# Patient Record
Sex: Male | Born: 1950 | Race: White | Hispanic: No | Marital: Married | Smoking: Never smoker
Health system: Southern US, Community
[De-identification: ages and names within clinical notes are randomized; demographics above are authoritative.]

## PROBLEM LIST (undated history)

## (undated) DIAGNOSIS — E669 Obesity, unspecified: Secondary | ICD-10-CM

## (undated) DIAGNOSIS — M549 Dorsalgia, unspecified: Secondary | ICD-10-CM

## (undated) DIAGNOSIS — I10 Essential (primary) hypertension: Secondary | ICD-10-CM

## (undated) DIAGNOSIS — K259 Gastric ulcer, unspecified as acute or chronic, without hemorrhage or perforation: Secondary | ICD-10-CM

## (undated) DIAGNOSIS — K922 Gastrointestinal hemorrhage, unspecified: Secondary | ICD-10-CM

## (undated) DIAGNOSIS — H269 Unspecified cataract: Secondary | ICD-10-CM

## (undated) DIAGNOSIS — G4730 Sleep apnea, unspecified: Secondary | ICD-10-CM

## (undated) DIAGNOSIS — R29898 Other symptoms and signs involving the musculoskeletal system: Secondary | ICD-10-CM

## (undated) HISTORY — DX: Gastrointestinal hemorrhage, unspecified: K92.2

---

## 2008-02-11 ENCOUNTER — Emergency Department (HOSPITAL_COMMUNITY): Admission: EM | Admit: 2008-02-11 | Discharge: 2008-02-11 | Payer: Self-pay | Admitting: Family Medicine

## 2009-02-05 ENCOUNTER — Emergency Department (HOSPITAL_COMMUNITY): Admission: EM | Admit: 2009-02-05 | Discharge: 2009-02-05 | Payer: Self-pay | Admitting: Family Medicine

## 2010-06-07 ENCOUNTER — Emergency Department (HOSPITAL_COMMUNITY): Admission: EM | Admit: 2010-06-07 | Discharge: 2010-06-07 | Payer: Self-pay | Admitting: Emergency Medicine

## 2010-06-09 ENCOUNTER — Emergency Department (HOSPITAL_COMMUNITY): Admission: EM | Admit: 2010-06-09 | Discharge: 2010-06-09 | Payer: Self-pay | Admitting: Family Medicine

## 2014-01-05 ENCOUNTER — Encounter (HOSPITAL_COMMUNITY): Payer: Self-pay | Admitting: Emergency Medicine

## 2014-01-05 ENCOUNTER — Emergency Department (HOSPITAL_COMMUNITY): Payer: Federal, State, Local not specified - PPO

## 2014-01-05 ENCOUNTER — Inpatient Hospital Stay (HOSPITAL_COMMUNITY)
Admission: EM | Admit: 2014-01-05 | Discharge: 2014-01-07 | DRG: 378 | Disposition: A | Discharge status: Home / Self Care | Payer: Federal, State, Local not specified - PPO | Attending: Family Medicine | Admitting: Family Medicine

## 2014-01-05 DIAGNOSIS — Z9989 Dependence on other enabling machines and devices: Secondary | ICD-10-CM

## 2014-01-05 DIAGNOSIS — M1990 Unspecified osteoarthritis, unspecified site: Secondary | ICD-10-CM | POA: Diagnosis present

## 2014-01-05 DIAGNOSIS — K922 Gastrointestinal hemorrhage, unspecified: Secondary | ICD-10-CM | POA: Diagnosis present

## 2014-01-05 DIAGNOSIS — T3995XA Adverse effect of unspecified nonopioid analgesic, antipyretic and antirheumatic, initial encounter: Secondary | ICD-10-CM | POA: Diagnosis present

## 2014-01-05 DIAGNOSIS — K257 Chronic gastric ulcer without hemorrhage or perforation: Secondary | ICD-10-CM

## 2014-01-05 DIAGNOSIS — Z6841 Body Mass Index (BMI) 40.0 and over, adult: Secondary | ICD-10-CM

## 2014-01-05 DIAGNOSIS — K2970 Gastritis, unspecified, without bleeding: Secondary | ICD-10-CM | POA: Diagnosis present

## 2014-01-05 DIAGNOSIS — E785 Hyperlipidemia, unspecified: Secondary | ICD-10-CM | POA: Diagnosis present

## 2014-01-05 DIAGNOSIS — K259 Gastric ulcer, unspecified as acute or chronic, without hemorrhage or perforation: Secondary | ICD-10-CM

## 2014-01-05 DIAGNOSIS — I10 Essential (primary) hypertension: Secondary | ICD-10-CM | POA: Diagnosis present

## 2014-01-05 DIAGNOSIS — D649 Anemia, unspecified: Secondary | ICD-10-CM

## 2014-01-05 DIAGNOSIS — K284 Chronic or unspecified gastrojejunal ulcer with hemorrhage: Principal | ICD-10-CM | POA: Diagnosis present

## 2014-01-05 DIAGNOSIS — K2990 Gastroduodenitis, unspecified, without bleeding: Secondary | ICD-10-CM

## 2014-01-05 DIAGNOSIS — D62 Acute posthemorrhagic anemia: Secondary | ICD-10-CM | POA: Diagnosis present

## 2014-01-05 DIAGNOSIS — G4733 Obstructive sleep apnea (adult) (pediatric): Secondary | ICD-10-CM | POA: Diagnosis present

## 2014-01-05 HISTORY — DX: Other symptoms and signs involving the musculoskeletal system: R29.898

## 2014-01-05 HISTORY — DX: Essential (primary) hypertension: I10

## 2014-01-05 HISTORY — DX: Obesity, unspecified: E66.9

## 2014-01-05 HISTORY — DX: Dorsalgia, unspecified: M54.9

## 2014-01-05 HISTORY — DX: Unspecified cataract: H26.9

## 2014-01-05 HISTORY — DX: Sleep apnea, unspecified: G47.30

## 2014-01-05 LAB — URINALYSIS, ROUTINE W REFLEX MICROSCOPIC
Bilirubin Urine: NEGATIVE
Glucose, UA: NEGATIVE mg/dL
Hgb urine dipstick: NEGATIVE
Ketones, ur: NEGATIVE mg/dL
Leukocytes, UA: NEGATIVE
Nitrite: NEGATIVE
Protein, ur: NEGATIVE mg/dL
Specific Gravity, Urine: 1.023 (ref 1.005–1.030)
Urobilinogen, UA: 0.2 mg/dL (ref 0.0–1.0)
pH: 5 (ref 5.0–8.0)

## 2014-01-05 LAB — CBC WITH DIFFERENTIAL/PLATELET
BASOS PCT: 0 % (ref 0–1)
Basophils Absolute: 0 10*3/uL (ref 0.0–0.1)
Eosinophils Absolute: 0 10*3/uL (ref 0.0–0.7)
Eosinophils Relative: 0 % (ref 0–5)
HEMATOCRIT: 20.7 % — AB (ref 39.0–52.0)
HEMOGLOBIN: 7 g/dL — AB (ref 13.0–17.0)
LYMPHS ABS: 2.6 10*3/uL (ref 0.7–4.0)
LYMPHS PCT: 13 % (ref 12–46)
MCH: 28.3 pg (ref 26.0–34.0)
MCHC: 33.8 g/dL (ref 30.0–36.0)
MCV: 83.8 fL (ref 78.0–100.0)
MONO ABS: 1.4 10*3/uL — AB (ref 0.1–1.0)
MONOS PCT: 7 % (ref 3–12)
NEUTROS ABS: 17 10*3/uL — AB (ref 1.7–7.7)
NEUTROS PCT: 81 % — AB (ref 43–77)
Platelets: 242 10*3/uL (ref 150–400)
RBC: 2.47 MIL/uL — AB (ref 4.22–5.81)
RDW: 14.2 % (ref 11.5–15.5)
WBC: 21.1 10*3/uL — AB (ref 4.0–10.5)

## 2014-01-05 LAB — COMPREHENSIVE METABOLIC PANEL
ALBUMIN: 3.4 g/dL — AB (ref 3.5–5.2)
ALK PHOS: 55 U/L (ref 39–117)
ALT: 10 U/L (ref 0–53)
AST: 10 U/L (ref 0–37)
BILIRUBIN TOTAL: 0.3 mg/dL (ref 0.3–1.2)
BUN: 34 mg/dL — AB (ref 6–23)
CHLORIDE: 98 meq/L (ref 96–112)
CO2: 21 meq/L (ref 19–32)
Calcium: 8.1 mg/dL — ABNORMAL LOW (ref 8.4–10.5)
Creatinine, Ser: 0.75 mg/dL (ref 0.50–1.35)
GFR calc Af Amer: >90 mL/min (ref >90)
Glucose, Bld: 188 mg/dL — ABNORMAL HIGH (ref 70–99)
POTASSIUM: 4.4 meq/L (ref 3.7–5.3)
Sodium: 134 mEq/L — ABNORMAL LOW (ref 137–147)
Total Protein: 6.2 g/dL (ref 6.0–8.3)

## 2014-01-05 LAB — OCCULT BLOOD, POC DEVICE: Fecal Occult Bld: POSITIVE — AB

## 2014-01-05 LAB — CBC
HEMATOCRIT: 19.1 % — AB (ref 39.0–52.0)
HEMOGLOBIN: 6.7 g/dL — AB (ref 13.0–17.0)
MCH: 29.8 pg (ref 26.0–34.0)
MCHC: 35.1 g/dL (ref 30.0–36.0)
MCV: 84.9 fL (ref 78.0–100.0)
Platelets: 209 10*3/uL (ref 150–400)
RBC: 2.25 MIL/uL — AB (ref 4.22–5.81)
RDW: 14.3 % (ref 11.5–15.5)
WBC: 18.2 10*3/uL — ABNORMAL HIGH (ref 4.0–10.5)

## 2014-01-05 LAB — INFLUENZA PANEL BY PCR (TYPE A & B)
H1N1 flu by pcr: NOT DETECTED
Influenza A By PCR: NEGATIVE
Influenza B By PCR: NEGATIVE

## 2014-01-05 LAB — PREPARE RBC (CROSSMATCH)

## 2014-01-05 LAB — TROPONIN I

## 2014-01-05 LAB — PRO B NATRIURETIC PEPTIDE: Pro B Natriuretic peptide (BNP): 127.2 pg/mL — ABNORMAL HIGH (ref 0–125)

## 2014-01-05 LAB — ABO/RH: ABO/RH(D): A POS

## 2014-01-05 MED ORDER — SODIUM CHLORIDE 0.9 % IV SOLN
INTRAVENOUS | Status: DC
  Administered 2014-01-05: 14:00:00 via INTRAVENOUS
  Administered 2014-01-06: 16:00:00 125 mL/h via INTRAVENOUS
  Administered 2014-01-06: 07:00:00 via INTRAVENOUS
  Administered 2014-01-07: 10:00:00 100 mL/h via INTRAVENOUS
  Administered 2014-01-07: via INTRAVENOUS

## 2014-01-05 MED ORDER — ATORVASTATIN CALCIUM 10 MG PO TABS
10.0000 mg | ORAL_TABLET | Freq: Every day | ORAL | Status: DC
  Administered 2014-01-05 – 2014-01-06 (×2): 10 mg via ORAL
  Filled 2014-01-05 (×3): qty 1

## 2014-01-05 MED ORDER — HYDROCHLOROTHIAZIDE 25 MG PO TABS
25.0000 mg | ORAL_TABLET | Freq: Every day | ORAL | Status: DC
  Administered 2014-01-07: 25 mg via ORAL
  Filled 2014-01-05 (×2): qty 1

## 2014-01-05 MED ORDER — PANTOPRAZOLE SODIUM 40 MG IV SOLR
40.0000 mg | Freq: Once | INTRAVENOUS | Status: AC
  Administered 2014-01-05: 40 mg via INTRAVENOUS
  Filled 2014-01-05: qty 40

## 2014-01-05 MED ORDER — SODIUM CHLORIDE 0.9 % IV BOLUS (SEPSIS)
500.0000 mL | Freq: Once | INTRAVENOUS | Status: AC
  Administered 2014-01-05: 500 mL via INTRAVENOUS

## 2014-01-05 MED ORDER — PANTOPRAZOLE SODIUM 40 MG IV SOLR
40.0000 mg | Freq: Two times a day (BID) | INTRAVENOUS | Status: DC
  Administered 2014-01-05: 40 mg via INTRAVENOUS
  Filled 2014-01-05 (×3): qty 40

## 2014-01-05 MED ORDER — ONDANSETRON HCL 4 MG PO TABS: 4.0000 mg | ORAL_TABLET | Freq: Four times a day (QID) | ORAL | Status: DC | PRN

## 2014-01-05 MED ORDER — ONDANSETRON HCL 4 MG/2ML IJ SOLN
4.0000 mg | Freq: Four times a day (QID) | INTRAMUSCULAR | Status: DC | PRN
Start: 2014-01-05 — End: 2014-01-07

## 2014-01-05 MED ORDER — CLONIDINE HCL 0.1 MG PO TABS
0.1000 mg | ORAL_TABLET | Freq: Two times a day (BID) | ORAL | Status: DC
  Administered 2014-01-05 – 2014-01-07 (×4): 0.1 mg via ORAL
  Filled 2014-01-05 (×6): qty 1

## 2014-01-05 MED ORDER — AMLODIPINE BESYLATE 10 MG PO TABS
10.0000 mg | ORAL_TABLET | Freq: Every day | ORAL | Status: DC
  Administered 2014-01-07: 10 mg via ORAL
  Filled 2014-01-05 (×2): qty 1

## 2014-01-05 MED ORDER — ACETAMINOPHEN 650 MG RE SUPP: 650.0000 mg | Freq: Four times a day (QID) | RECTAL | Status: DC | PRN

## 2014-01-05 MED ORDER — ACETAMINOPHEN 325 MG PO TABS
650.0000 mg | ORAL_TABLET | Freq: Four times a day (QID) | ORAL | Status: DC | PRN
  Administered 2014-01-05 – 2014-01-07 (×3): 650 mg via ORAL
  Filled 2014-01-05 (×3): qty 2

## 2014-01-05 NOTE — Progress Notes (Signed)
CRITICAL VALUE ALERT  Critical value received:  Hgb 6.7  Date of notification:  01/05/14  Time of notification:  21:45  Critical value read back: yes  Nurse who received alert:  Faraaz Wolin, Joan MayansKristina Nicole  MD notified (1st page): Family medicine, Dr. Ermalinda MemosBradshaw  Time of first page:  21:46  MD notified (2nd page):  Time of second page:  Responding MD:  Dr. Ermalinda MemosBradshaw, Family Medicine  Time MD responded:  [id number]

## 2014-01-05 NOTE — ED Notes (Signed)
Pa  at bedside. 

## 2014-01-05 NOTE — H&P (Signed)
Family Medicine Teaching [facility] Admission History and Physical Service Pager: [phone]  Patient name: Gary PaisJerry W Moser Medical record number: [id number] Date of birth: [birth year] Age: 63 y.o. Gender: male  Primary Care Provider: No primary provider on file.  Chief Complaint: Weakness, fatigue, and melena  Assessment and Plan: Gary George is a 63 y.o. year old male with past medical history significant for hypertension and OSA presenting with symptomatic anemia likely due to an upper GI bleed.   Acute blood loss anemia and Upper GI bleed, symptomatic- likely due to NSAID-induced PUD. - Admit to telemetry for close observation of blood products - GI consulted - appreciate recommendations and management - Hemoglobin 7.0, symptomatic with weakness, fatigue, and exertional dyspnea. - Transfused PRBC times one unit, check post transfusion CBC - Monitor Q 8 hour CBCs - BID IV protonix  Leukocytosis - Consider infectious etiology, CXR clear and UA ordered - Possibly De-margination due to acute anemia.  - Abd exam benign so will defer any antibiotics currently - Check flu panel with myalgias and remote family contacts.   HTN - Continue home meds with the exception of ACE inhibitor as he is likely volume depleted.  - Continue Amlodipine, Clonidine, HCTZ  OSA- CPAP ordered HLD - Continue lipitor  FEN/GI: NPO, IV NS at 125 Prophylaxis: SCDs, no heparin with GI bleed Disposition: Telemetry for close obs for GIB and blood administration Code Status: Full  History of Present Illness: Gary PaisJerry W Lomas is a 63 y.o. year old male presenting with several episodes of melanotic stools starting 4 days ago that resolved 2 days ago. For the same 4 days he's had myalgias, mild chills, decreased appetite, and generalized weakness. His diarrhea that was initially present resolved after about 2 days but his other symptoms persisted. He states that he does have family numbers they currently have  the flu however he has not had any contact with him. He also notes having the flu vaccine given this year. He denies overt nausea but states that he had one episode of dry heaves emesis 2 nights ago. He states that he has not eaten due to decreased appetite since the onset of symptoms 4 days ago. He has tolerated by mouth fluids easily. Also notes exertional dyspnea for the last 2 days. He denies chest pain, dyspnea, abdominal pain, hematochezia, lightheadedness, dizziness, and syncope.   He states that he was recently told by his P[facility] at the [facility] that his blood counts are dropping slightly and to stop taking diclofenac.   Patient Active Problem List   Diagnosis Date Noted  . Upper GI bleed 01/05/2014   Past Medical History: Past Medical History  Diagnosis Date  . Sleep apnea   . Hypertension    Past Surgical History: History reviewed. No pertinent past surgical history. Social History: History  Substance Use Topics  . Smoking status: Never Smoker   . Smokeless tobacco: Not on file  . Alcohol Use: No   For any additional social history documentation, please refer to relevant sections of EMR.  Family History: History reviewed. No pertinent family history. Allergies: No Known Allergies No current facility-administered medications on file prior to encounter.   No current outpatient prescriptions on file prior to encounter.   Review Of Systems: Per HPI, Otherwise 12 point review of systems was performed and was unremarkable.  Physical Exam: BP 161/66  Pulse 98  Temp(Src) 99.6 F (37.6 C) (Oral)  Resp 27  SpO2 100% Exam: Gen: NAD, alert, cooperative with exam,  obese, pleasant HEENT: NCAT, EOMI, PERRL, MMM  CV: RRR, good S1/S2, no murmur, distant heart sounds Resp: CTABL, no wheezes, non-labored Abd: SNTND, BS present, no guarding or organomegaly Ext: 1-2 + Pitting edema R> L Neuro: Alert and oriented, strength 5/5 and sensation intact in all 4 extremities except for left upper  extremity which has 4/5 strength, EOMI, normal speech Skin: Hyperpigmented discoloration of the right lower extremity consistent with chronic venous stasis,   Labs and Imaging: CBC BMET   Recent Labs Lab 01/05/14 0952  WBC 21.1*  HGB 7.0*  HCT 20.7*  PLT 242    Recent Labs Lab 01/05/14 0952  NA 134*  K 4.4  CL 98  CO2 21  BUN 34*  CREATININE 0.75  GLUCOSE 188*  CALCIUM 8.1*       Recent Labs Lab 01/05/14 0952  AST 10  ALT 10  ALKPHOS 55  BILITOT 0.3  PROT 6.2  ALBUMIN 3.4*    FOBT positive Troponin Neg X 1 proBNP 127   Kevin Fenton, MD 01/05/2014, 12:35 PM Family medicine teaching service Pager [phone], text pages welcome

## 2014-01-05 NOTE — ED Provider Notes (Signed)
Patient seen/examined in the Emergency Department in conjunction with Midlevel Provider [place] Patient reports fatigue Exam : pt was reported to have melena with anemia noted Plan: will need admission, pt accepts to have blood products   Joya Gaskinsonald W Kashia Brossard, MD 01/05/14 1127

## 2014-01-05 NOTE — Consult Note (Signed)
[facility] Referring Provider: Dr. Bradshaw from Family Medicine Primary Care Physician:  No primary provider on file. Primary Gastroenterologist:  None Reason for Consultation: Melena, anemia   HPI:  Gary George is a 63 y.o. male who has been taking alleve 2 pills twice daily for several months for back pains.  No PPI routinely.  Started having melenic stools 2-3 days ago, several that day but none in 2 days.  Presented here with weakness, fatigue and was found to have Hb 7.0.  No CP or SOB.  Never had GI bleeding before.  CRC screening via hemoccult cards, all negative per patient.   Past Medical History  Diagnosis Date  . Sleep apnea   . Hypertension   morbid obesity  History reviewed. No pertinent past surgical history.  Prior to Admission medications   Medication Sig Start Date End Date Taking? Authorizing Provider  amLODipine (NORVASC) 10 MG tablet Take 10 mg by mouth daily.   Yes Historical Provider, MD  cloNIDine (CATAPRES) 0.1 MG tablet Take 0.1 mg by mouth 2 (two) times daily.   Yes Historical Provider, MD  diclofenac (VOLTAREN) 50 MG EC tablet  11/27/13  Yes Historical Provider, MD  hydrochlorothiazide (HYDRODIURIL) 25 MG tablet Take 25 mg by mouth daily.   Yes Historical Provider, MD  lisinopril (PRINIVIL,ZESTRIL) 40 MG tablet Take 40 mg by mouth daily.   Yes Historical Provider, MD  atorvastatin (LIPITOR) 20 MG tablet Take 10 mg by mouth at bedtime.  11/27/13   Historical Provider, MD    Current Facility-Administered Medications  Medication Dose Route Frequency Provider Last Rate Last Dose  . 0.9 %  sodium chloride infusion   Intravenous Continuous Samuel L Bradshaw, MD      . acetaminophen (TYLENOL) tablet 650 mg  650 mg Oral Q6H PRN Samuel L Bradshaw, MD       Or  . acetaminophen (TYLENOL) suppository 650 mg  650 mg Rectal Q6H PRN Samuel L Bradshaw, MD      . [START ON 01/06/2014] amLODipine (NORVASC) tablet 10 mg  10 mg Oral Daily Samuel L Bradshaw,  MD      . atorvastatin (LIPITOR) tablet 10 mg  10 mg Oral QHS Samuel L Bradshaw, MD      . cloNIDine (CATAPRES) tablet 0.1 mg  0.1 mg Oral BID Samuel L Bradshaw, MD      . [START ON 01/06/2014] hydrochlorothiazide (HYDRODIURIL) tablet 25 mg  25 mg Oral Daily Samuel L Bradshaw, MD      . ondansetron (ZOFRAN) tablet 4 mg  4 mg Oral Q6H PRN Samuel L Bradshaw, MD       Or  . ondansetron (ZOFRAN) injection 4 mg  4 mg Intravenous Q6H PRN Samuel L Bradshaw, MD      . pantoprazole (PROTONIX) injection 40 mg  40 mg Intravenous Q12H Samuel L Bradshaw, MD        Allergies as of 01/05/2014  . (No Known Allergies)    History reviewed. No pertinent family history.  History   Social History  . Marital Status: Unknown    Spouse Name: N/A    Number of Children: N/A  . Years of Education: N/A   Occupational History  . Not on file.   Social History Main Topics  . Smoking status: Never Smoker   . Smokeless tobacco: Not on file  . Alcohol Use: No  . Drug Use: Not on file  . Sexual Activity: Not on file   Other Topics Concern  .   Not on file   Social History Narrative  . No narrative on file     Review of Systems: Pertinent positive and negative review of systems were noted in the above HPI section. Complete review of systems was performed and was otherwise normal.   Physical Exam: Vital signs in last 24 hours: Temp:  [98.2 F (36.8 C)-99.6 F (37.6 C)] 98.7 F (37.1 C) (01/10 1420) Pulse Rate:  [80-98] 80 (01/10 1420) Resp:  [18-27] 18 (01/10 1420) BP: (142-161)/(66-84) 158/75 mmHg (01/10 1420) SpO2:  [100 %] 100 % (01/10 1420) Weight:  [340 lb 4.8 oz (154.359 kg)] 340 lb 4.8 oz (154.359 kg) (01/10 1259)   Constitutional: generally well-appearing except morbid obesity Psychiatric: alert and oriented x3 Eyes: extraocular movements intact Mouth: oral pharynx moist, no lesions Neck: supple no lymphadenopathy Cardiovascular: heart regular rate and rhythm Lungs: clear to  auscultation bilaterally Abdomen: soft, nontender, nondistended, no obvious ascites, no peritoneal signs, normal bowel sounds Extremities: no lower extremity edema bilaterally Skin: no lesions on visible extremities   Lab Results:  Recent Labs  01/05/14 0952  WBC 21.1*  HGB 7.0*  HCT 20.7*  PLT 242  MCV 83.8   BMET  Recent Labs  01/05/14 0952  NA 134*  K 4.4  CL 98  CO2 21  GLUCOSE 188*  BUN 34*  CREATININE 0.75  CALCIUM 8.1*   LFT  Recent Labs  01/05/14 0952  BILITOT 0.3  AST 10  ALT 10  ALKPHOS 55  PROT 6.2  ALBUMIN 3.4*   Imaging/Other Results: Dg Chest 2 View  01/05/2014   CLINICAL DATA:  Shortness of Breath  EXAM: CHEST  2 VIEW  COMPARISON:  None.  FINDINGS: Cardiomediastinal silhouette is unremarkable. Mild elevation of the right hemidiaphragm. No acute infiltrate or pleural effusion. No pulmonary edema. Left basilar atelectasis. Mild degenerative changes thoracic spine.  IMPRESSION: No acute infiltrate or pulmonary edema. Left basilar atelectasis. Mild elevation of the right hemidiaphragm.   Electronically Signed   By: Liviu  Pop M.D.   On: 01/05/2014 10:44      Impression/Plan: 63 y.o. male with GI bleeding, likely upper GI bleed from NSAIDs  IV PPI twice daily for now.  NPO after MN for EGD tomorrow.  He knows that he should be avoiding NSAIDs for now.   Jacobs, Daniel P, MD  01/05/2014, 2:27 PM [facility] Pager [phone]    

## 2014-01-05 NOTE — Progress Notes (Signed)
Received report from ED.  

## 2014-01-05 NOTE — ED Notes (Signed)
admitting at bedside.  

## 2014-01-05 NOTE — ED Notes (Signed)
Patient transported to X-ray 

## 2014-01-05 NOTE — ED Provider Notes (Signed)
CSN: [id number]     Arrival date & time 01/05/14  8295 History   First MD Initiated Contact with Patient 01/05/14 [phone]     Chief Complaint  Patient presents with  . Fatigue   (Consider location/radiation/quality/duration/timing/severity/associated sxs/prior Treatment) HPI Comments: Patient is a 63 year old male past medical history significant for hypertension, obstructive sleep apnea, hyperlipidemia presenting to the emergency department for 4 days of flulike symptoms. Patient states he woke up Wednesday morning with myalgias, subjective chills, and non-bloody diarrhea. The patient states the diarrhea last about two days, but he has had continued myalgias, generalized weakness, decreased PO intake. The patient does describe the stool as dark. This morning he had increased fatigue and dyspnea on exertion walking around his home. He is on CPAP at night for OSA, but does not have any oxygen requirement during the day. He denies any SOB at rest or currently. Denies any chest pain, chest tightness, cough, nasal congestion, rhinorrhea, abdominal pain, nausea, vomiting. He has 2 sick contacts at home with similar symptoms.   Past Medical History  Diagnosis Date  . Sleep apnea   . Hypertension    History reviewed. No pertinent past surgical history. History reviewed. No pertinent family history. History  Substance Use Topics  . Smoking status: Never Smoker   . Smokeless tobacco: Not on file  . Alcohol Use: No    Review of Systems  Constitutional: Positive for fatigue. Negative for fever.  Respiratory: Negative for cough and chest tightness.   Cardiovascular: Negative for chest pain and leg swelling (no change from chronic).  Gastrointestinal: Positive for diarrhea. Negative for nausea, vomiting and abdominal pain.  Musculoskeletal: Positive for myalgias. Negative for back pain and neck pain.  All other systems reviewed and are negative.    Allergies  Review of patient's allergies  indicates no known allergies.  Home Medications  No current outpatient prescriptions on file. BP 161/84  Pulse 91  Temp(Src) 98.2 F (36.8 C) (Oral)  Resp 18  Ht 5\' 10"  (1.778 m)  Wt 340 lb 4.8 oz (154.359 kg)  BMI 48.83 kg/m2  SpO2 100% Physical Exam  Constitutional: He is oriented to person, place, and time. He appears well-developed and well-nourished. No distress.  Examination difficult d/t body habitus  HENT:  Head: Normocephalic and atraumatic.  Right Ear: External ear normal.  Left Ear: External ear normal.  Nose: Nose normal.  Mouth/Throat: Oropharynx is clear and moist.  Eyes: Conjunctivae are normal.  Conjunctival pallor  Neck: Normal range of motion. Neck supple.  Cardiovascular: Regular rhythm, normal heart sounds, intact distal pulses and normal pulses.  Tachycardia present.   Pulmonary/Chest: Effort normal and breath sounds normal. No respiratory distress. He has no wheezes. He has no rales. He exhibits no tenderness.  Abdominal: Soft. There is no tenderness.  Genitourinary: Rectal exam shows no external hemorrhoid, no internal hemorrhoid, no fissure, no mass, no tenderness and anal tone normal. Guaiac positive stool.  Melena on DRE  Musculoskeletal: Normal range of motion. He exhibits edema (chronic 1+ right leg edema).  Neurological: He is alert and oriented to person, place, and time.  Skin: Skin is warm and dry. He is not diaphoretic.  Psychiatric: He has a normal mood and affect.    ED Course  Procedures (including critical care time) Medications  amLODipine (NORVASC) tablet 10 mg (not administered)  atorvastatin (LIPITOR) tablet 10 mg (not administered)  cloNIDine (CATAPRES) tablet 0.1 mg (not administered)  hydrochlorothiazide (HYDRODIURIL) tablet 25 mg (not administered)  0.9 %  sodium chloride infusion (not administered)  acetaminophen (TYLENOL) tablet 650 mg (not administered)    Or  acetaminophen (TYLENOL) suppository 650 mg (not administered)   ondansetron (ZOFRAN) tablet 4 mg (not administered)    Or  ondansetron (ZOFRAN) injection 4 mg (not administered)  pantoprazole (PROTONIX) injection 40 mg (not administered)  sodium chloride 0.9 % bolus 500 mL (500 mLs Intravenous New Bag/Given 01/05/14 1108)  pantoprazole (PROTONIX) injection 40 mg (40 mg Intravenous Given 01/05/14 1117)    Labs Review Labs Reviewed  CBC WITH DIFFERENTIAL - Abnormal; Notable for the following:    WBC 21.1 (*)    RBC 2.47 (*)    Hemoglobin 7.0 (*)    HCT 20.7 (*)    Neutrophils Relative % 81 (*)    Neutro Abs 17.0 (*)    Monocytes Absolute 1.4 (*)    All other components within normal limits  COMPREHENSIVE METABOLIC PANEL - Abnormal; Notable for the following:    Sodium 134 (*)    Glucose, Bld 188 (*)    BUN 34 (*)    Calcium 8.1 (*)    Albumin 3.4 (*)    All other components within normal limits  PRO B NATRIURETIC PEPTIDE - Abnormal; Notable for the following:    Pro B Natriuretic peptide (BNP) 127.2 (*)    All other components within normal limits  OCCULT BLOOD, POC DEVICE - Abnormal; Notable for the following:    Fecal Occult Bld POSITIVE (*)    All other components within normal limits  TROPONIN I  OCCULT BLOOD X 1 CARD TO LAB, STOOL  CBC  CBC  URINALYSIS, ROUTINE W REFLEX MICROSCOPIC  INFLUENZA PANEL BY PCR (TYPE A & B, H1N1)  TYPE AND SCREEN  ABO/RH  PREPARE RBC (CROSSMATCH)   Imaging Review Dg Chest 2 View  01/05/2014   CLINICAL DATA:  Shortness of Breath  EXAM: CHEST  2 VIEW  COMPARISON:  None.  FINDINGS: Cardiomediastinal silhouette is unremarkable. Mild elevation of the right hemidiaphragm. No acute infiltrate or pleural effusion. No pulmonary edema. Left basilar atelectasis. Mild degenerative changes thoracic spine.  IMPRESSION: No acute infiltrate or pulmonary edema. Left basilar atelectasis. Mild elevation of the right hemidiaphragm.   Electronically Signed   By: Natasha MeadLiviu  Pop M.D.   On: 01/05/2014 10:44    EKG Interpretation     Date/Time:  Saturday January 05 2014 09:38:59 EST Ventricular Rate:  98 PR Interval:  151 QRS Duration: 99 QT Interval:  428 QTC Calculation: 546 R Axis:   27 Text Interpretation:  Sinus tachycardia Ventricular premature complex Low voltage, precordial leads RSR' in V1 or V2, right VCD or RVH Borderline abnrm T, anterolateral leads Prolonged QT interval Confirmed by Bebe ShaggyWICKLINE  MD, DONALD ([id number]) on 01/05/2014 10:07:57 AM            MDM   1. Anemia   2. Upper GI bleed     Afebrile, NAD, non-toxic appearing, AAOx4. Patient tachycardic, but vital signs otherwise stable. Patient with symptomatic anemia likely d/t upper GI bleed. Hemoglobin 7.0. [facility] d/c Voltaren d/t decreased H&H on Dec 24th. IV protonix given. IVF given. Family Medicine team consulted for admission and they will consult with GI themselves. Type and screen obtained. Will wait for transfusion until type and screen returns as patient does not appear to need emergent transfusion at this time. Patient will be admitted for further evaluation and management. Patient d/w with Dr. Bebe ShaggyWickline, agrees with plan.  Jeannetta Ellis, PA-C 01/05/14 1357

## 2014-01-05 NOTE — ED Notes (Signed)
Pt from home c/o generalized weakness, Diarrhea on Thurs. No N/V or sob.

## 2014-01-05 NOTE — ED Provider Notes (Signed)
Medical screening examination/treatment/procedure(s) were conducted as a shared visit with non-physician practitioner(s) and myself.  I personally evaluated the patient during the encounter.  EKG Interpretation    Date/Time:  Saturday January 05 2014 09:38:59 EST Ventricular Rate:  98 PR Interval:  151 QRS Duration: 99 QT Interval:  428 QTC Calculation: 546 R Axis:   27 Text Interpretation:  Sinus tachycardia Ventricular premature complex Low voltage, precordial leads RSR' in V1 or V2, right VCD or RVH Borderline abnrm T, anterolateral leads Prolonged QT interval Confirmed by Bebe ShaggyWICKLINE  MD, Jamone Garrido [phone]([id number]) on 01/05/2014 10:07:57 AM             Pt agrees to be admitted and receive blood products if necessary Pt admitted for further stabilization   Joya Gaskinsonald W Aquanetta Schwarz, MD 01/05/14 1615

## 2014-01-06 ENCOUNTER — Encounter (HOSPITAL_COMMUNITY): Payer: Self-pay

## 2014-01-06 ENCOUNTER — Telehealth: Payer: Self-pay

## 2014-01-06 ENCOUNTER — Encounter (HOSPITAL_COMMUNITY)
Admission: EM | Disposition: A | Discharge status: Home / Self Care | Payer: Self-pay | Source: Home / Self Care | Attending: Family Medicine

## 2014-01-06 DIAGNOSIS — K922 Gastrointestinal hemorrhage, unspecified: Secondary | ICD-10-CM

## 2014-01-06 DIAGNOSIS — D62 Acute posthemorrhagic anemia: Secondary | ICD-10-CM

## 2014-01-06 DIAGNOSIS — K259 Gastric ulcer, unspecified as acute or chronic, without hemorrhage or perforation: Secondary | ICD-10-CM

## 2014-01-06 HISTORY — PX: ESOPHAGOGASTRODUODENOSCOPY: SHX5428

## 2014-01-06 LAB — CBC
HEMATOCRIT: 23.3 % — AB (ref 39.0–52.0)
HEMATOCRIT: 21.2 % — AB (ref 39.0–52.0)
Hemoglobin: 7.9 g/dL — ABNORMAL LOW (ref 13.0–17.0)
Hemoglobin: 7.3 g/dL — ABNORMAL LOW (ref 13.0–17.0)
MCH: 29.7 pg (ref 26.0–34.0)
MCH: 30.2 pg (ref 26.0–34.0)
MCHC: 33.9 g/dL (ref 30.0–36.0)
MCHC: 34.4 g/dL (ref 30.0–36.0)
MCV: 87.6 fL (ref 78.0–100.0)
MCV: 87.6 fL (ref 78.0–100.0)
Platelets: 185 10*3/uL (ref 150–400)
Platelets: 185 10*3/uL (ref 150–400)
RBC: 2.66 MIL/uL — ABNORMAL LOW (ref 4.22–5.81)
RBC: 2.42 MIL/uL — ABNORMAL LOW (ref 4.22–5.81)
RDW: 14.5 % (ref 11.5–15.5)
RDW: 14.7 % (ref 11.5–15.5)
WBC: 15.1 10*3/uL — AB (ref 4.0–10.5)
WBC: 13.3 10*3/uL — ABNORMAL HIGH (ref 4.0–10.5)

## 2014-01-06 SURGERY — EGD (ESOPHAGOGASTRODUODENOSCOPY)
Anesthesia: Moderate Sedation

## 2014-01-06 MED ORDER — BUTAMBEN-TETRACAINE-BENZOCAINE 2-2-14 % EX AERO
INHALATION_SPRAY | CUTANEOUS | Status: DC | PRN
  Administered 2014-01-06: 2 via TOPICAL

## 2014-01-06 MED ORDER — FENTANYL CITRATE 0.05 MG/ML IJ SOLN
INTRAMUSCULAR | Status: DC | PRN
  Administered 2014-01-06 (×4): 25 ug via INTRAVENOUS

## 2014-01-06 MED ORDER — MIDAZOLAM HCL 5 MG/ML IJ SOLN
INTRAMUSCULAR | Status: AC
  Filled 2014-01-06: qty 2

## 2014-01-06 MED ORDER — MIDAZOLAM HCL 10 MG/2ML IJ SOLN
INTRAMUSCULAR | Status: DC | PRN
  Administered 2014-01-06 (×3): 2 mg via INTRAVENOUS
  Administered 2014-01-06: 1 mg via INTRAVENOUS

## 2014-01-06 MED ORDER — FENTANYL CITRATE 0.05 MG/ML IJ SOLN
INTRAMUSCULAR | Status: AC
  Filled 2014-01-06: qty 2

## 2014-01-06 MED ORDER — PANTOPRAZOLE SODIUM 40 MG PO TBEC
40.0000 mg | DELAYED_RELEASE_TABLET | Freq: Two times a day (BID) | ORAL | Status: DC
  Administered 2014-01-06 – 2014-01-07 (×3): 40 mg via ORAL
  Filled 2014-01-06 (×5): qty 1

## 2014-01-06 NOTE — Progress Notes (Signed)
Patient placed on auto CPAP 8.0-20.0 at this time via full face mask. Tolerating well, RT will continue to monitor.

## 2014-01-06 NOTE — H&P (View-Only) (Signed)
[facility] Referring Provider: Dr. Ermalinda MemosBradshaw from [facility] Primary Care Physician:  No primary provider on file. Primary Gastroenterologist:  None Reason for Consultation: Melena, anemia   HPI:  Gary George is a 63 y.o. male who has been taking alleve 2 pills twice daily for several months for back pains.  No PPI routinely.  Started having melenic stools 2-3 days ago, several that day but none in 2 days.  Presented here with weakness, fatigue and was found to have Hb 7.0.  No CP or SOB.  Never had GI bleeding before.  CRC screening via hemoccult cards, all negative per patient.   Past Medical History  Diagnosis Date  . Sleep apnea   . Hypertension   morbid obesity  History reviewed. No pertinent past surgical history.  Prior to Admission medications   Medication Sig Start Date End Date Taking? Authorizing Provider  amLODipine (NORVASC) 10 MG tablet Take 10 mg by mouth daily.   Yes Historical Provider, MD  cloNIDine (CATAPRES) 0.1 MG tablet Take 0.1 mg by mouth 2 (two) times daily.   Yes Historical Provider, MD  diclofenac (VOLTAREN) 50 MG EC tablet  11/27/13  Yes Historical Provider, MD  hydrochlorothiazide (HYDRODIURIL) 25 MG tablet Take 25 mg by mouth daily.   Yes Historical Provider, MD  lisinopril (PRINIVIL,ZESTRIL) 40 MG tablet Take 40 mg by mouth daily.   Yes Historical Provider, MD  atorvastatin (LIPITOR) 20 MG tablet Take 10 mg by mouth at bedtime.  11/27/13   Historical Provider, MD    Current Facility-Administered Medications  Medication Dose Route Frequency Provider Last Rate Last Dose  . 0.9 %  sodium chloride infusion   Intravenous Continuous [name], MD      . acetaminophen (TYLENOL) tablet 650 mg  650 mg Oral Q6H PRN [name], MD       Or  . acetaminophen (TYLENOL) suppository 650 mg  650 mg Rectal Q6H PRN [name], MD      . Melene Muller[START ON 01/06/2014] amLODipine (NORVASC) tablet 10 mg  10 mg Oral Daily [name],  MD      . atorvastatin (LIPITOR) tablet 10 mg  10 mg Oral QHS [name], MD      . cloNIDine (CATAPRES) tablet 0.1 mg  0.1 mg Oral BID [name], MD      . Melene Muller[START ON 01/06/2014] hydrochlorothiazide (HYDRODIURIL) tablet 25 mg  25 mg Oral Daily [name], MD      . ondansetron [facility](ZOFRAN) tablet 4 mg  4 mg Oral Q6H PRN [name], MD       Or  . ondansetron [facility](ZOFRAN) injection 4 mg  4 mg Intravenous Q6H PRN [name], MD      . pantoprazole (PROTONIX) injection 40 mg  40 mg Intravenous Q12H [name], MD        Allergies as of 01/05/2014  . (No Known Allergies)    History reviewed. No pertinent family history.  History   Social History  . Marital Status: Unknown    Spouse Name: N/A    Number of Children: N/A  . Years of Education: N/A   Occupational History  . Not on file.   Social History Main Topics  . Smoking status: Never Smoker   . Smokeless tobacco: Not on file  . Alcohol Use: No  . Drug Use: Not on file  . Sexual Activity: Not on file   Other Topics Concern  .  Not on file   Social History Narrative  . No narrative on file     Review of Systems: Pertinent positive and negative review of systems were noted in the above HPI section. Complete review of systems was performed and was otherwise normal.   Physical Exam: Vital signs in last 24 hours: Temp:  [98.2 F (36.8 C)-99.6 F (37.6 C)] 98.7 F (37.1 C) (01/10 1420) Pulse Rate:  [80-98] 80 (01/10 1420) Resp:  [18-27] 18 (01/10 1420) BP: (142-161)/(66-84) 158/75 mmHg (01/10 1420) SpO2:  [100 %] 100 % (01/10 1420) Weight:  [340 lb 4.8 oz (154.359 kg)] 340 lb 4.8 oz (154.359 kg) (01/10 1259)   Constitutional: generally well-appearing except morbid obesity Psychiatric: alert and oriented x3 Eyes: extraocular movements intact Mouth: oral pharynx moist, no lesions Neck: supple no lymphadenopathy Cardiovascular: heart regular rate and rhythm Lungs: clear to  auscultation bilaterally Abdomen: soft, nontender, nondistended, no obvious ascites, no peritoneal signs, normal bowel sounds Extremities: no lower extremity edema bilaterally Skin: no lesions on visible extremities   Lab Results:  Recent Labs  01/05/14 0952  WBC 21.1*  HGB 7.0*  HCT 20.7*  PLT 242  MCV 83.8   BMET  Recent Labs  01/05/14 0952  NA 134*  K 4.4  CL 98  CO2 21  GLUCOSE 188*  BUN 34*  CREATININE 0.75  CALCIUM 8.1*   LFT  Recent Labs  01/05/14 0952  BILITOT 0.3  AST 10  ALT 10  ALKPHOS 55  PROT 6.2  ALBUMIN 3.4*   Imaging/Other Results: Dg Chest 2 View  01/05/2014   CLINICAL DATA:  Shortness of Breath  EXAM: CHEST  2 VIEW  COMPARISON:  None.  FINDINGS: Cardiomediastinal silhouette is unremarkable. Mild elevation of the right hemidiaphragm. No acute infiltrate or pleural effusion. No pulmonary edema. Left basilar atelectasis. Mild degenerative changes thoracic spine.  IMPRESSION: No acute infiltrate or pulmonary edema. Left basilar atelectasis. Mild elevation of the right hemidiaphragm.   Electronically Signed   By: [name] M.D.   On: 01/05/2014 10:44      Impression/Plan: 63 y.o. male with GI bleeding, likely upper GI bleed from NSAIDs  IV PPI twice daily for now.  NPO after MN for EGD tomorrow.  He knows that he should be avoiding NSAIDs for now.   Rachael Fee, MD  01/05/2014, 2:27 PM [facility] Pager [phone]

## 2014-01-06 NOTE — Progress Notes (Signed)
Call placed twice for CPAP machine for patient. Awaiting respitory therapist

## 2014-01-06 NOTE — Telephone Encounter (Signed)
Message copied by Donata DuffLEWIS, Ralston Venus L on Sun Jan 06, 2014  7:09 PM ------      Message from: ComerJACOBS, Melton AlarANIEL P      Created: Sun Jan 06, 2014 10:24 AM       He needs rov hosp follow up in 4-5 weeks, CBC the day prior            Thanks       ------

## 2014-01-06 NOTE — Op Note (Signed)
[facility] [address] [place], [id number]   ENDOSCOPY PROCEDURE REPORT  PATIENT: Gary George, [name]  MR#: [id number] BIRTHDATE: December 15, 1951 , 62  yrs. old GENDER: Male ENDOSCOPIST: Rachael Feeaniel P Sayda Grable, MD PROCEDURE DATE:  01/06/2014 PROCEDURE:  EGD w/ biopsy ASA CLASS:     Class III INDICATIONS:  melena, anemia, high NSAID use. MEDICATIONS: Versed 7 mg IV and Fentanyl 100 mcg IV TOPICAL ANESTHETIC: Cetacaine Spray  DESCRIPTION OF PROCEDURE: After the risks benefits and alternatives of the procedure were thoroughly explained, informed consent was obtained.  The Pentax Gastroscope [id number] endoscope was introduced through the mouth and advanced to the second portion of the duodenum. Without limitations.  The instrument was slowly withdrawn as the mucosa was fully examined.  There were three ulcers in pre-pyrloric stomach.  Two were small (2-943mm across), shallow and clean based.  One was larger (6mm across), contained a red pigmented but flat spot.  The mucosa in the region of the ulcers was edematous but not clearly neoplastic. There was mild, distal gastritis as well, this was biopsied and sent to pathology.  There was no blood in UGI tract.  The examination was otherwise normal.  Retroflexed views revealed no abnormalities.     The scope was then withdrawn from the patient and the procedure completed. COMPLICATIONS: There were no complications. ENDOSCOPIC IMPRESSION: There were three ulcers in pre-pyrloric stomach.  Two were small (2-683mm across), shallow and clean based.  One was larger (6mm across), contained a red pigmented but flat spot.  The mucosa in the region of the ulcers was edematous but not clearly neoplastic. There was mild, distal gastritis as well, this was biopsied and sent to pathology.  There was no blood in UGI tract.  The examination was otherwise normal.  RECOMMENDATIONS: He should try to completely avoid NSAIDs, using tylenol for  pain as needed.  He should stay on PPI twice daily.  OTC iron pill once daily.  My office will contact him about follow up office appt in about 4 weeks.  From there, I will schedule him for repeat EGD to confirm ulcer healing.  If biopsies show H.  pylori I will start him on appropriate antibiotics.  The ulcers are at low risk for rebleeding and he is safe for d/c today from GI perspective.   eSigned:  Rachael Feeaniel P Lavita Pontius, MD 01/06/2014 10:21 AM

## 2014-01-06 NOTE — Progress Notes (Signed)
Pt places self on and off machine.  May need rn/rt to push button to turn on for him.

## 2014-01-06 NOTE — Progress Notes (Signed)
FMTS Attending Admission Note: Gary Hopman,MD I  have seen and examined this patient, reviewed their chart. I have discussed this patient with the resident. I agree with the resident's findings, assessment and care plan.  

## 2014-01-06 NOTE — H&P (Signed)
FMTS Attending Admission Note: Gary Bard,MD I  have seen and examined this patient, reviewed their chart. I have discussed this patient with the resident. I agree with the resident's findings, assessment and care plan.   Patient presented with hx of fatigue,SOB after episodes of dark diarrhea which started few days ago,his diarrhea has since then resolved,he did endorse mild left upper abdominal pain,he denies any other GI symptoms,no gross blood in his stool. Today he denies any weakness,chest pain of SOB,he feels ok S/P blood transfusion.  Filed Vitals:   01/06/14 1015 01/06/14 1020 01/06/14 1158 01/06/14 1230  BP: 123/58 121/57 120/70 117/63  Pulse: 70 75 72 68  Temp:    98.3 F (36.8 C)  TempSrc:    Oral  Resp: 24 24  20   Height:      Weight:      SpO2: 95% 96%  98%   Exam: Gen: Not in distress. Resp/CV: wnl. Abd: Mild epigastric and left upper quadrant tenderness.BS+ Ext: No edema.  A/P. 63 Y/O M with 1. Fatigue and SOB likely due to acute blood loss.    Hemoglobin on admission was 7.0 which dropped further to 6.7 after 1 unit of PRBC.    I agree with 2 more unit of transfusion.   GI consult for possible scoping.   Avoid NSAID.  2. Abdominal pain: Improving.     Currently on protonix.     GI for endoscopy today.  3. HTN: COntinue home medication.    May be related to excessive NSAID use causing ulcer.

## 2014-01-06 NOTE — Progress Notes (Signed)
Brief Update:  Nurse call from floor.  Pt had large melanotic mixed with red blood bowel movement.  I discussed with Dr. Christella HartiganJacobs who feels as though this is likely residual blood from his upper GI source.  In further discussion it does sound like the upper GI ulcers were fairly profound and likely were associated with significant bleeding although at the time of endoscopy had stopped.   He remains hemodynamically stable and symptomatically improved.  Will plan to trend hemoglobins. Continue PPI.  If hemoglobin is improved and/or stable overnight we'll plan for discharge in the morning.  If continues to worsen or hemodynamic instability will consider lower endoscopy however likely not indicated as Dr. Christella HartiganJacobs feels as though this is residual blood.  Okay to eat.  Andrena MewsMichael D Rigby, DO [facility] Resident - PGY-3 01/06/2014 4:25 PM

## 2014-01-06 NOTE — Interval H&P Note (Signed)
History and Physical Interval Note:  01/06/2014 9:53 AM  Gary George  has presented today for surgery, with the diagnosis of gi bleeding  The various methods of treatment have been discussed with the patient and family. After consideration of risks, benefits and other options for treatment, the patient has consented to  Procedure(s): ESOPHAGOGASTRODUODENOSCOPY (EGD) (N/A) as a surgical intervention .  The patient's history has been reviewed, patient examined, no change in status, stable for surgery.  I have reviewed the patient's chart and labs.  Questions were answered to the patient's satisfaction.     Rachael FeeJacobs, Daniel P

## 2014-01-06 NOTE — Progress Notes (Signed)
Second unit of blood is completed. Patient tolerated well. No chills, fever, or pain.

## 2014-01-06 NOTE — Progress Notes (Signed)
Family Medicine Teaching Service Daily Progress Note Intern Pager: [phone]  Patient name: Gary George Medical record number: [id number] Date of birth: [birth year] Age: 63 y.o. Gender: male  Primary Care Provider: No primary provider on file. Consultants: GI, Dr. Christella HartiganJacobs with [place] Code Status: Full  Pt Overview and Major Events to Date:  1/10: admitted and given 3 units PRBC 1/11: Planning EGD today  Assessment and Plan:  Gary George is a 63 y.o. year old male with past medical history significant for hypertension and OSA presenting with symptomatic anemia likely due to an upper GI bleed.   Acute blood loss anemia and Upper GI bleed, symptomatic- likely due to NSAID-induced PUD.  - Evidence of continued bleeding with slight drop in Hgb after 1 unit PRBC - GI consulted - appreciate recommendations and management, planned EGD today - transfused now 3 units PRBC  - post transfusion and  Q 8 hour CBCs  - BID IV protonix   Leukocytosis  - Consider infectious etiology- Flu, cxr, and UA unrevealing - Mild abd tenderness in LLQ this am - Possibly de-margination form acute anemia - Still defer antibiotics  HTN  - Continue home meds with the exception of ACE inhibitor as he is likely volume depleted.  - Continue Amlodipine, Clonidine, HCTZ   OSA- CPAP ordered  HLD - Continue lipitor   FEN/GI: NPO, IV NS at 125  Prophylaxis: SCDs, no heparin with GI bleed  Disposition: Telemetry for close obs for GIB and blood administration  Code Status: Full  Subjective: Feeling well this am, slept well. Denies continued melena, hematochezia, or hemetemesis  Objective: Temp:  [98 F (36.7 C)-99.6 F (37.6 C)] 98.5 F (36.9 C) (01/11 0800) Pulse Rate:  [69-98] 75 (01/11 0800) Resp:  [16-27] 20 (01/11 0800) BP: (114-163)/(65-84) 115/65 mmHg (01/11 0800) SpO2:  [97 %-100 %] 97 % (01/11 0800) Weight:  [340 lb 4.8 oz (154.359 kg)] 340 lb 4.8 oz (154.359 kg) (01/10 1259) Physical  Exam: Gen: NAD, alert, cooperative with exam, obese, pleasant  HEENT: NCAT, MMM CV: RRR, good S1/S2, no murmur, distant heart sounds  Resp: CTABL, no wheezes, non-labored  Abd: Soft, obese, mild tenderness to palpation of LLQ, no guarding  Ext: 1-2 + Pitting edema R> L  Neuro: Alert and oriented, strength 5/5 and sensation intact in all 4 extremities except for left upper extremity which has 4/5 strength  Laboratory:  Recent Labs Lab 01/05/14 0952 01/05/14 2117  WBC 21.1* 18.2*  HGB 7.0* 6.7*  HCT 20.7* 19.1*  PLT 242 209    Recent Labs Lab 01/05/14 0952  NA 134*  K 4.4  CL 98  CO2 21  BUN 34*  CREATININE 0.75  CALCIUM 8.1*  PROT 6.2  BILITOT 0.3  ALKPHOS 55  ALT 10  AST 10  GLUCOSE 188*   FOBT positive  Troponin Neg X 1  proBNP 127  Imaging/Diagnostic Tests: DG chest 01/05/2014 IMPRESSION:  No acute infiltrate or pulmonary edema. Left basilar atelectasis.  Mild elevation of the right hemidiaphragm.   Elenora GammaSamuel L Chrisette Man, MD 01/06/2014, 9:23 AM PGY-2, [facility] FPTS Intern pager: [phone], text pages welcome

## 2014-01-06 NOTE — Progress Notes (Signed)
First unit of blood was tolerated well. No chills,n/v, HA, or fevers. RN will hang second bag.

## 2014-01-07 DIAGNOSIS — K257 Chronic gastric ulcer without hemorrhage or perforation: Secondary | ICD-10-CM

## 2014-01-07 LAB — TYPE AND SCREEN
ABO/RH(D): A POS
ANTIBODY SCREEN: NEGATIVE
UNIT DIVISION: 0
Unit division: 0
Unit division: 0

## 2014-01-07 LAB — CBC
HCT: 20.5 % — ABNORMAL LOW (ref 39.0–52.0)
HCT: 21.8 % — ABNORMAL LOW (ref 39.0–52.0)
HEMOGLOBIN: 7.2 g/dL — AB (ref 13.0–17.0)
Hemoglobin: 7.4 g/dL — ABNORMAL LOW (ref 13.0–17.0)
MCH: 30.4 pg (ref 26.0–34.0)
MCH: 30 pg (ref 26.0–34.0)
MCHC: 35.1 g/dL (ref 30.0–36.0)
MCHC: 33.9 g/dL (ref 30.0–36.0)
MCV: 86.5 fL (ref 78.0–100.0)
MCV: 88.3 fL (ref 78.0–100.0)
PLATELETS: 202 10*3/uL (ref 150–400)
Platelets: 184 10*3/uL (ref 150–400)
RBC: 2.37 MIL/uL — AB (ref 4.22–5.81)
RBC: 2.47 MIL/uL — AB (ref 4.22–5.81)
RDW: 14.7 % (ref 11.5–15.5)
RDW: 15 % (ref 11.5–15.5)
WBC: 11.5 10*3/uL — ABNORMAL HIGH (ref 4.0–10.5)
WBC: 11.8 10*3/uL — ABNORMAL HIGH (ref 4.0–10.5)

## 2014-01-07 MED ORDER — PANTOPRAZOLE SODIUM 40 MG PO TBEC
40.0000 mg | DELAYED_RELEASE_TABLET | Freq: Two times a day (BID) | ORAL | Status: DC
Start: 2014-01-07 — End: 2014-02-05

## 2014-01-07 NOTE — Clinical Documentation Improvement (Signed)
THIS DOCUMENT IS NOT A PERMANENT PART OF THE MEDICAL RECORD  Please update your documentation with the medical record to reflect your response to this query. If you need help knowing how to do this please call [phone].  01/07/14   Dear Dr. Jarvis NewcomerGrunz,  In a better effort to capture your patient's severity of illness, reflect appropriate length of stay and utilization of resources, a review of the patient medical record has revealed the following indicators.   Based on your clinical judgment, please clarify and document in a progress note and/or discharge summary the clinical condition associated with the following supporting information: In responding to this query please exercise your independent judgment.  The fact that a query is asked, does not imply that any particular answer is desired or expected.   Dorene SorrowJerry was admitted with GI bleed due to PUD. "CPAP at night for OSA" and "Morbid Obesity" is documented throughout the chart. Please determine if this could be further specified as any of the below. Thank you!  Possible Clinical Conditions?  - OSA requiring CPAP   Reviewed: additional documentation in the medical record Will be reported in Discharge summary  Thank You, Altan Kraai B. Jarvis NewcomerGrunz, MD, PGY-1 01/07/2014 1:38 PM

## 2014-01-07 NOTE — Discharge Summary (Signed)
[facility] Discharge Summary  Patient name: Gary George Medical record number: [id number] Date of birth: 1951/07/24 Age: 63 y.o. Gender: male Date of Admission: 01/05/2014  Date of Discharge: 01/07/2014 Admitting Physician: Janit PaganKehinde Eniola, MD  Primary Care Provider: No primary provider on file. Consultants: Gastroenterology  Indication for Hospitalization: Upper GI bleed  Discharge Diagnoses/Problem List:  Patient Active Problem List   Diagnosis Date Noted  . Gastric ulcer 01/06/2014  . Upper GI bleed 01/05/2014  . OSA on CPAP 01/05/2014  . HTN (hypertension) 01/05/2014  . Morbid obesity 01/05/2014  . Acute blood loss anemia 01/05/2014  NSAID-induced PUD  Disposition: Discharged home  Discharge Condition: Stable  Discharge Exam:  Gen: Morbidly obese man in NAD, pleasant  HEENT: NCAT, MMM  CV: RRR, no murmur, distant heart sounds  Resp: CTABL, no wheezes, non-labored, decreased vs. distant breath sounds  Abd: Soft, obese, no tenderness/distention, no guarding  Ext: 1+ Pitting edema bilaterally  Neuro: Alert and oriented, strength 5/5 and sensation intact in all 4 extremities except for left upper extremity which has 4/5 strength due to shoulder pain   Brief Hospital Course:  Gary George is a 63 y.o. year old male with a past medical history significant for HTN, OA with long-term NSAID use, morbid obesity, and OSA requiring CPAP presenting with symptomatic anemia due to an upper GI bleed.  Hemoblogin was 7.0 on arrival and was 6.7 after transfusion of 1u PRBCs. Though there was no active bleeding, FOBT was positive and 2 further units were transfused with adequate response (to 7.9). Endoscopy showed 3 pre-pyloric ulcers, one of which was large, that were hemostatic at time of procedure. Distal gastritis was also seen and biopsied for H. pylori. Gastroenterology recommended PPI therapy BID, avoidance of all NSAIDS, OTC iron daily, and 4 week  follow up with plans to treat if H. pylori returns positive from the biopsy. He remained hemodynamically stable and was discharged 01/07/2014.  Leukocytosis was present on admission and improved (18.2 > 11.5) without antibiotics, most likely due to stress demargination. Pt was afebrile, and had no apparent source of infection. Flu was negative, CXR and urinalysis were unrevealing.   Chronic conditions were addressed as follows:  HTN: Continued amlodipine, clonidine, and HCTZ. ACE inhibitor was held for volume depletion and normotension.   OSA: CPAP administered. Hyperlipidemia: Continue lipitor   Issues for Follow Up:  - Repeat CBC to monitor anemia. - Monitor for further melenotic stools.  - Pt to follow up with [facility] in 4 weeks.  Significant Procedures: Upper Endoscopy 1/11 by Dr. Rob Buntinganiel Jacobs: Results below.   Significant Labs and Imaging:   Recent Labs Lab 01/06/14 2020 01/07/14 0545 01/07/14 1500  WBC 13.3* 11.5* 11.8*  HGB 7.3* 7.2* 7.4*  HCT 21.2* 20.5* 21.8*  PLT 185 184 202    Recent Labs Lab 01/05/14 0952  NA 134*  K 4.4  CL 98  CO2 21  GLUCOSE 188*  BUN 34*  CREATININE 0.75  CALCIUM 8.1*  ALKPHOS 55  AST 10  ALT 10  ALBUMIN 3.4*   FOBT positive  Troponin Neg X 1  proBNP 127   Endoscopy: 3 pre-pyloric hemostatic ulcers and gastritis which was biopsied  DG chest 01/05/2014  IMPRESSION:  No acute infiltrate or pulmonary edema. Left basilar atelectasis.  Mild elevation of the right hemidiaphragm.   01/05/2014 16:12  Color, Urine YELLOW  APPearance CLEAR  Specific Gravity, Urine 1.023  pH 5.0  Glucose NEGATIVE  Bilirubin  Urine NEGATIVE  Ketones, ur NEGATIVE  Protein NEGATIVE  Urobilinogen, UA 0.2  Nitrite NEGATIVE  Leukocytes, UA NEGATIVE  Hgb urine dipstick NEGATIVE   Results/Tests Pending at Time of Discharge: None  Discharge Medications:    Medication List         amLODipine 10 MG tablet  Commonly known as:  NORVASC  Take 10  mg by mouth daily.     atorvastatin 20 MG tablet  Commonly known as:  LIPITOR  Take 10 mg by mouth at bedtime.     cloNIDine 0.1 MG tablet  Commonly known as:  CATAPRES  Take 0.1 mg by mouth 2 (two) times daily.     diclofenac 50 MG EC tablet  Commonly known as:  VOLTAREN     hydrochlorothiazide 25 MG tablet  Commonly known as:  HYDRODIURIL  Take 25 mg by mouth daily.     lisinopril 40 MG tablet  Commonly known as:  PRINIVIL,ZESTRIL  Take 40 mg by mouth daily.     pantoprazole 40 MG tablet  Commonly known as:  PROTONIX  Take 1 tablet (40 mg total) by mouth 2 (two) times daily before a meal.       Discharge Instructions: Please refer to Patient Instructions section of EMR for full details.  Patient was counseled important signs and symptoms that should prompt return to medical care, changes in medications, dietary instructions, activity restrictions, and follow up appointments.   Follow-Up Appointments: Follow-up Information   Follow up with Hazeline Junker, MD On 01/16/2014. (2:00pm)    Specialty:  Family Medicine   Contact information:   [address] [place] [address] [phone]      Hazeline Junker, MD 01/07/2014, 6:09 PM PGY-1, [facility]

## 2014-01-07 NOTE — Progress Notes (Signed)
Chaplain visited with pt who had requested information about Advance Directive. Chaplain gave AD documents to pt for his review.  Chaplain mentioned to pt that chaplain will be available to return for a visit today or tomorrow (if he would like to pursue further the AD or for any other reason).   01/07/14 1300  Clinical Encounter Type  Visited With Patient  Referral From Patient  Advance Directives (For Healthcare)  Patient requests advance directive information Advance directive packet given    Rulon Abideavid B Sherrod, chaplain, pager [phone]

## 2014-01-07 NOTE — Discharge Instructions (Signed)
You were treated with transfusions of red blood cells for a GI bleed that was due to peptic ulcer disease (PUD). This is stable and you are cleared for discharge. You should not take ANY NSAIDS, which include aleve, naproxen, ibuprofen, advil, motrin, BC powder, goody's powder. You CAN take TYLENOL. If you experience any more blood loss or very dark stools, or throwing up blood you should be seen by a doctor immediately.   A new medicine is being prescribed for you called protonix. You should take this twice a day everyday.   You have a new patient and hospital follow up appointment with Dr. Jarvis NewcomerGrunz, who you saw in the hospital. This is next Wednesday at 2:00pm. Please arrive early.

## 2014-01-07 NOTE — Discharge Planning (Signed)
Gary PaisJerry W George to be D/C'd Home per MD order.  Discussed with the patient and all questions fully answered.    Medication List         amLODipine 10 MG tablet  Commonly known as:  NORVASC  Take 10 mg by mouth daily.     atorvastatin 20 MG tablet  Commonly known as:  LIPITOR  Take 10 mg by mouth at bedtime.     cloNIDine 0.1 MG tablet  Commonly known as:  CATAPRES  Take 0.1 mg by mouth 2 (two) times daily.     diclofenac 50 MG EC tablet  Commonly known as:  VOLTAREN     hydrochlorothiazide 25 MG tablet  Commonly known as:  HYDRODIURIL  Take 25 mg by mouth daily.     lisinopril 40 MG tablet  Commonly known as:  PRINIVIL,ZESTRIL  Take 40 mg by mouth daily.     pantoprazole 40 MG tablet  Commonly known as:  PROTONIX  Take 1 tablet (40 mg total) by mouth 2 (two) times daily before a meal.        VVS, Skin clean, dry and intact without evidence of skin break down, no evidence of skin tears noted. IV catheter discontinued intact. Site without signs and symptoms of complications. Dressing and pressure applied.  An After Visit Summary was printed and given to the patient.  D/c education completed with patient/family including follow up instructions, medication list, d/c activities limitations if indicated, with other d/c instructions as indicated by MD - patient able to verbalize understanding, all questions fully answered.   Patient instructed to return to ED, call 911, or call MD for any changes in condition.   Patient escorted via WC, and D/C home via private auto.  Beckey DowningFlores, Gary Owen F 01/07/2014 5:02 PM

## 2014-01-07 NOTE — Progress Notes (Signed)
FMTS Attending Note  I personally saw and evaluated the patient. The plan of care was discussed with the resident team. I agree with the assessment and plan as documented by the resident.   No further episodes of emesis, no abdominal pain, patient feels ready to go home today  1. Upper GI bleed due PUD - hemoglobin stable, home with PPI 2. HTN/OSA/HLD stable  Disposition: home today with GI follow up as outpatient, as he is [age] he would benefit from colonoscopy as outpatient for colon cancer screening  Donnella ShamKyle Charron Coultas MD

## 2014-01-07 NOTE — Telephone Encounter (Signed)
Letter mailed

## 2014-01-07 NOTE — Progress Notes (Signed)
Family Medicine Teaching Service Daily Progress Note Intern Pager: [phone]  Patient name: Gary George Medical record number: [id number] Date of birth: 02-14-1951 Age: 63 y.o. Gender: male  Primary Care Provider: No primary provider on file. Consultants: GI, Dr. Christella HartiganJacobs with [facility] Code Status: Full  Pt Overview and Major Events to Date:  1/10: admitted and given 3 units PRBC 1/11: Planning EGD today 1/12: Hgb stable   Assessment and Plan: Gary George is a 63 y.o. year old male with past medical history significant for hypertension and OSA presenting with symptomatic anemia due to an upper GI bleed.   Acute blood loss anemia and Upper GI bleed, symptomatic: Due to PUD x3 (2 large) hemostatic ulcers likely NSAID-induced. - Hgb stable s/p transfusions and EGD (7.3 > 7.2), no further melanotic stools. Will recheck in AM - BID IV protonix   Leukocytosis Improving (18.2 >> 11.5), favor stress demargination - Consider infectious etiology- Flu, cxr, and UA unrevealing - defer antibiotics  HTN BP normotensive - Continue home meds with the exception of ACE inhibitor as he is likely volume depleted.  - Continue Amlodipine, Clonidine, HCTZ   OSA- CPAP ordered  HLD - Continue lipitor    FEN/GI: Heart healthy diet, NS @ 100 Prophylaxis: SCDs, no heparin with GI bleed   Disposition: Continued observation, likely D/C tomorrow if hgb/bleeding stable.   Subjective: Reports resolution of abdominal pain. Denies continued melena, hematochezia, or hemetemesis. No CP/SOB, dizziness drastically improved. Feels his color is better. Reports taking aleve and other NSAIDs for a long time.   Objective: Temp:  [98 F (36.7 C)-98.4 F (36.9 C)] 98.2 F (36.8 C) (01/12 0604) Pulse Rate:  [68-86] 68 (01/12 0604) Resp:  [18-28] 18 (01/12 0604) BP: (117-149)/(48-70) 119/66 mmHg (01/12 0604) SpO2:  [95 %-100 %] 97 % (01/12 0604) Physical Exam: Gen: Morbidly obese man in NAD, pleasant HEENT:  NCAT, MMM CV: RRR, no murmur, distant heart sounds  Resp: CTABL, no wheezes, non-labored, decreased vs. distant breath sounds Abd: Soft, obese, no tenderness/distention, no guarding  Ext: 1+ Pitting edema bilaterally Neuro: Alert and oriented, strength 5/5 and sensation intact in all 4 extremities except for left upper extremity which has 4/5 strength due to shoulder pain  Laboratory:  Recent Labs Lab 01/06/14 1226 01/06/14 2020 01/07/14 0545  WBC 15.1* 13.3* 11.5*  HGB 7.9* 7.3* 7.2*  HCT 23.3* 21.2* 20.5*  PLT 185 185 184    Recent Labs Lab 01/05/14 0952  NA 134*  K 4.4  CL 98  CO2 21  BUN 34*  CREATININE 0.75  CALCIUM 8.1*  PROT 6.2  BILITOT 0.3  ALKPHOS 55  ALT 10  AST 10  GLUCOSE 188*   FOBT positive  Troponin Neg X 1  proBNP 127  Imaging/Diagnostic Tests: DG chest 01/05/2014 IMPRESSION:  No acute infiltrate or pulmonary edema. Left basilar atelectasis.  Mild elevation of the right hemidiaphragm.   Hazeline Junkeryan Ethal Gotay, MD 01/07/2014, 9:40 AM PGY-1, [facility] FPTS Intern pager: [phone], text pages welcome

## 2014-01-08 ENCOUNTER — Encounter (HOSPITAL_COMMUNITY): Payer: Self-pay | Admitting: Gastroenterology

## 2014-01-08 ENCOUNTER — Telehealth: Payer: Self-pay | Admitting: Family Medicine

## 2014-01-08 MED ORDER — CLARITHROMYCIN 500 MG PO TABS: 500.0000 mg | ORAL_TABLET | Freq: Two times a day (BID) | ORAL | Status: DC

## 2014-01-08 MED ORDER — METRONIDAZOLE 500 MG PO TABS: 500.0000 mg | ORAL_TABLET | Freq: Two times a day (BID) | ORAL | Status: DC

## 2014-01-08 NOTE — Telephone Encounter (Signed)
I called and discussed EGD result with patient,+H.Pylori and need to be treated. Plan to start him on Triple therapy. He is already on Protonix 40 mg which I suggested he continues as part of his Triple therapy. In addition to protonix, I will start him on Clarithromycin and metronidazole both for a total of 14 days. [facility]. I e-prescribed his medication.

## 2014-01-08 NOTE — Discharge Summary (Signed)
I agree with the discharge summary as documented.   Luster Hechler MD  

## 2014-01-12 LAB — CULTURE, BLOOD (ROUTINE X 2)
CULTURE: NO GROWTH
Culture: NO GROWTH

## 2014-01-16 ENCOUNTER — Ambulatory Visit (INDEPENDENT_AMBULATORY_CARE_PROVIDER_SITE_OTHER): Payer: Federal, State, Local not specified - PPO | Admitting: Family Medicine

## 2014-01-16 ENCOUNTER — Encounter: Payer: Self-pay | Admitting: Family Medicine

## 2014-01-16 VITALS — BP 142/84 | HR 91 | Temp 97.5°F | Ht 70.0 in | Wt 350.0 lb

## 2014-01-16 DIAGNOSIS — Z13220 Encounter for screening for lipoid disorders: Secondary | ICD-10-CM

## 2014-01-16 DIAGNOSIS — K922 Gastrointestinal hemorrhage, unspecified: Secondary | ICD-10-CM

## 2014-01-16 DIAGNOSIS — I10 Essential (primary) hypertension: Secondary | ICD-10-CM

## 2014-01-16 DIAGNOSIS — A048 Other specified bacterial intestinal infections: Secondary | ICD-10-CM

## 2014-01-16 DIAGNOSIS — M549 Dorsalgia, unspecified: Secondary | ICD-10-CM | POA: Insufficient documentation

## 2014-01-16 DIAGNOSIS — K259 Gastric ulcer, unspecified as acute or chronic, without hemorrhage or perforation: Secondary | ICD-10-CM

## 2014-01-16 DIAGNOSIS — Z20828 Contact with and (suspected) exposure to other viral communicable diseases: Secondary | ICD-10-CM

## 2014-01-16 DIAGNOSIS — K2970 Gastritis, unspecified, without bleeding: Secondary | ICD-10-CM

## 2014-01-16 DIAGNOSIS — B9681 Helicobacter pylori [H. pylori] as the cause of diseases classified elsewhere: Secondary | ICD-10-CM | POA: Insufficient documentation

## 2014-01-16 DIAGNOSIS — K2940 Chronic atrophic gastritis without bleeding: Secondary | ICD-10-CM

## 2014-01-16 LAB — CBC WITH DIFFERENTIAL/PLATELET
BASOS ABS: 0.1 10*3/uL (ref 0.0–0.1)
BASOS PCT: 0 % (ref 0–1)
EOS PCT: 1 % (ref 0–5)
Eosinophils Absolute: 0.1 10*3/uL (ref 0.0–0.7)
HEMATOCRIT: 28 % — AB (ref 39.0–52.0)
Hemoglobin: 9.1 g/dL — ABNORMAL LOW (ref 13.0–17.0)
Lymphocytes Relative: 18 % (ref 12–46)
Lymphs Abs: 2.2 10*3/uL (ref 0.7–4.0)
MCH: 27.5 pg (ref 26.0–34.0)
MCHC: 32.5 g/dL (ref 30.0–36.0)
MCV: 84.6 fL (ref 78.0–100.0)
MONO ABS: 1 10*3/uL (ref 0.1–1.0)
Monocytes Relative: 8 % (ref 3–12)
Neutro Abs: 8.8 10*3/uL — ABNORMAL HIGH (ref 1.7–7.7)
Neutrophils Relative %: 73 % (ref 43–77)
PLATELETS: 392 10*3/uL (ref 150–400)
RBC: 3.31 MIL/uL — ABNORMAL LOW (ref 4.22–5.81)
RDW: 15.8 % — ABNORMAL HIGH (ref 11.5–15.5)
WBC: 12 10*3/uL — ABNORMAL HIGH (ref 4.0–10.5)

## 2014-01-16 LAB — POCT GLYCOSYLATED HEMOGLOBIN (HGB A1C): Hemoglobin A1C: 5.3

## 2014-01-16 LAB — LDL CHOLESTEROL, DIRECT: Direct LDL: 55 mg/dL

## 2014-01-16 MED ORDER — OSELTAMIVIR PHOSPHATE 75 MG PO CAPS: 75.0000 mg | ORAL_CAPSULE | Freq: Two times a day (BID) | ORAL | Status: DC

## 2014-01-16 MED ORDER — CENTRUM PO CHEW: 1.0000 | CHEWABLE_TABLET | Freq: Every day | ORAL | Status: AC

## 2014-01-16 NOTE — Assessment & Plan Note (Addendum)
Reports compliance with Norvasc 10 mg, clonidine 0.1 mg twice a day, HCTZ 25 mg, and lisinopril 40 mg. BP 142/84 today. Will monitor this at next visit and augment medications as necessary, though with no history of diabetes (Hb A1c today 5.3%) or CKD and his age over 4560, I am inclined to be satisfied with a goal of less than 150/90.

## 2014-01-16 NOTE — Patient Instructions (Signed)
Thank you for coming in today!  We are checking some labs today, and I will call you if they are abnormal. If you do not hear from me by phone or letter in 2 weeks, please call us as I may have been unable to reach you.   Call in 2 months to make an appointment to follow up with me around 04/16/2014.  - Bring all medications in a bag to your visits.  If you experience bleeding or notice blood in your stool or unusually dark stools, or blood in throw up, or persistent light-headedness or fatigue, you should be evaluated by a medical provider. If you feel chest pain or shortness of breath, be seen immediately.   Please feel free to call with any questions or concerns at any time, at [phone]. - Dr. Jarvis NewcomerGrunz

## 2014-01-16 NOTE — Assessment & Plan Note (Signed)
Diagnosed on endoscopy biopsy from 01/06/2014, s/p treatment with clarithromycin and flagyl. Continuing twice daily PPI.

## 2014-01-16 NOTE — Assessment & Plan Note (Signed)
No further symptoms of anemia. Discharged with a hemoglobin of 7.4 and rechecking CBC today. Continue PPI twice daily, avoidance of NSAIDs, avoidance of tobacco and alcohol, iron supplementation. To followup with GI on February 10.
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
## 2014-02-04 ENCOUNTER — Other Ambulatory Visit (INDEPENDENT_AMBULATORY_CARE_PROVIDER_SITE_OTHER): Payer: Federal, State, Local not specified - PPO

## 2014-02-04 DIAGNOSIS — K922 Gastrointestinal hemorrhage, unspecified: Secondary | ICD-10-CM

## 2014-02-04 LAB — CBC WITH DIFFERENTIAL/PLATELET
BASOS ABS: 0.1 10*3/uL (ref 0.0–0.1)
Basophils Relative: 0.6 % (ref 0.0–3.0)
Eosinophils Absolute: 0.1 10*3/uL (ref 0.0–0.7)
Eosinophils Relative: 1.2 % (ref 0.0–5.0)
HEMATOCRIT: 27.5 % — AB (ref 39.0–52.0)
Hemoglobin: 8.8 g/dL — ABNORMAL LOW (ref 13.0–17.0)
LYMPHS ABS: 2.2 10*3/uL (ref 0.7–4.0)
Lymphocytes Relative: 22.2 % (ref 12.0–46.0)
MCHC: 31.9 g/dL (ref 30.0–36.0)
MCV: 79.6 fl (ref 78.0–100.0)
MONO ABS: 0.9 10*3/uL (ref 0.1–1.0)
Monocytes Relative: 8.7 % (ref 3.0–12.0)
Neutro Abs: 6.8 10*3/uL (ref 1.4–7.7)
Neutrophils Relative %: 67.3 % (ref 43.0–77.0)
PLATELETS: 319 10*3/uL (ref 150.0–400.0)
RBC: 3.45 Mil/uL — ABNORMAL LOW (ref 4.22–5.81)
RDW: 18.1 % — AB (ref 11.5–14.6)
WBC: 10.1 10*3/uL (ref 4.5–10.5)

## 2014-02-05 ENCOUNTER — Ambulatory Visit (INDEPENDENT_AMBULATORY_CARE_PROVIDER_SITE_OTHER): Payer: Federal, State, Local not specified - PPO | Admitting: Gastroenterology

## 2014-02-05 ENCOUNTER — Encounter: Payer: Self-pay | Admitting: Gastroenterology

## 2014-02-05 VITALS — BP 120/70 | HR 70 | Ht 67.0 in | Wt 351.0 lb

## 2014-02-05 DIAGNOSIS — K259 Gastric ulcer, unspecified as acute or chronic, without hemorrhage or perforation: Secondary | ICD-10-CM

## 2014-02-05 MED ORDER — PANTOPRAZOLE SODIUM 40 MG PO TBEC: 40.0000 mg | DELAYED_RELEASE_TABLET | Freq: Every day | ORAL | Status: DC

## 2014-02-05 NOTE — Progress Notes (Signed)
Review of pertinent gastrointestinal problems: 1. NSAID+ H. Pylori + Gastric ulcers; led to melena, anemia; EGD [name] 12/2013 in-ptThere were three ulcers in pre-pyrloric stomach. Two were small (2-107mm across), shallow and clean based. One was larger (6mm across), contained a red pigmented but flat spot. The mucosa in the region of the ulcers was edematous but not clearly neoplastic. There was mild, distal gastritis as well, this was biopsied and sent to pathology. There was no blood in UGI tract. The examination was otherwise normal.  Treated with appropriate antibiotics and avoiding NSAIDs (had been taking alleve and another NSAID).   HPI: This is a  very pleasant morbidly obese 63 year old man whom I last saw about a month ago when he was hospitalized with a bleeding gastric ulcer.  He has been on tylenol since ulcer diagnosed. Doesn't work as well for his back pains.  Has been taking ppi bid since ulcer (was on none before).  Has been taking iron daily.  Blood counts yesterday showed hemoglobin of 8.8 which is better than when he was discharged but obviously not yet back to normal.    Past Medical History  Diagnosis Date  . Sleep apnea   . Hypertension   . Cataract     Right eye   . Obesity   . Back pain   . Left arm weakness     r/t prior broken bone   . Upper GI bleed     Past Surgical History  Procedure Laterality Date  . Esophagogastroduodenoscopy N/A 01/06/2014    Procedure: ESOPHAGOGASTRODUODENOSCOPY (EGD);  Surgeon: Rachael Fee, MD;  Location: [facility] ENDOSCOPY;  Service: Endoscopy;  Laterality: N/A;    Current Outpatient Prescriptions  Medication Sig Dispense Refill  . amLODipine (NORVASC) 10 MG tablet Take 10 mg by mouth daily.      Marland Kitchen atorvastatin (LIPITOR) 20 MG tablet Take 10 mg by mouth at bedtime.       . cloNIDine (CATAPRES) 0.1 MG tablet Take 0.1 mg by mouth 2 (two) times daily.      . hydrochlorothiazide (HYDRODIURIL) 25 MG tablet Take 25 mg by mouth daily.       Marland Kitchen lisinopril (PRINIVIL,ZESTRIL) 40 MG tablet Take 40 mg by mouth daily.      . multivitamin-iron-minerals-folic acid (CENTRUM) chewable tablet Chew 1 tablet by mouth daily.  30 tablet  5  . pantoprazole (PROTONIX) 40 MG tablet Take 1 tablet (40 mg total) by mouth 2 (two) times daily before a meal.  60 tablet  0   No current facility-administered medications for this visit.    Allergies as of 02/05/2014  . (No Known Allergies)    Family History  Problem Relation Age of Onset  . Aneurysm Mother     History   Social History  . Marital Status: Unknown    Spouse Name: N/A    Number of Children: N/A  . Years of Education: N/A   Occupational History  . Not on file.   Social History Main Topics  . Smoking status: Never Smoker   . Smokeless tobacco: Never Used  . Alcohol Use: No  . Drug Use: Not on file  . Sexual Activity: Not on file   Other Topics Concern  . Not on file   Social History Narrative  . No narrative on file      Physical Exam: BP 120/70  Pulse 70  Ht 5\' 7"  (1.702 m)  Wt 351 lb (159.213 kg)  BMI 54.96 kg/m2 Constitutional: generally well-appearing except  for morbid obesity Psychiatric: alert and oriented x3 Abdomen: soft, nontender, nondistended, no obvious ascites, no peritoneal signs, normal bowel sounds     Assessment and plan: 63 y.o. male with NSAID, H. pylori ulcer in his stomach one month ago  Tylenol for his back pain is really not controlling his pain very well. I think it is probably safe that he resumed a single dose of Aleve once daily. He ulcer was in part from H. pylori that was proven by biopsy. He will also stay on proton pump inhibitor for as long as he requires NSAID pain medicine which is probably going to be indefinite period he needs repeat EGD confirm ulcer healing in about one month's time. I think it is safe to cut back to once daily proton pump inhibitor for now.

## 2014-02-05 NOTE — Patient Instructions (Addendum)
[facility] with moderate sedation (BMI exceeds limit for [facility] procedures) in 4 weeks to check gastric ulcer healing. Cut back protonix to one pill per day.  You will need to take this type of antiacid medicine for as long as you require Alleve type pain med every day. Ok to resume a single Alleve once daily.

## 2014-02-08 ENCOUNTER — Encounter: Payer: Self-pay | Admitting: Gastroenterology

## 2014-02-11 ENCOUNTER — Other Ambulatory Visit: Payer: Self-pay | Admitting: *Deleted

## 2014-02-11 NOTE — Telephone Encounter (Signed)
Received request for refill of protonix 40mg , but Dr. Christella HartiganJacobs prescribed 1-year supply on 2/10. Will not refill.

## 2014-02-13 ENCOUNTER — Telehealth: Payer: Self-pay

## 2014-02-13 ENCOUNTER — Telehealth: Payer: Self-pay | Admitting: Gastroenterology

## 2014-02-13 MED ORDER — PANTOPRAZOLE SODIUM 40 MG PO TBEC: 40.0000 mg | DELAYED_RELEASE_TABLET | Freq: Every day | ORAL | Status: DC

## 2014-02-13 NOTE — Telephone Encounter (Signed)
rx resent  

## 2014-02-13 NOTE — Telephone Encounter (Signed)
[facility] phoned in, [facility].  It was set on print eariler so re-sent it via " normal" mode.

## 2014-02-28 ENCOUNTER — Encounter (HOSPITAL_COMMUNITY)
Admission: RE | Disposition: A | Discharge status: Home / Self Care | Payer: Self-pay | Source: Ambulatory Visit | Attending: Gastroenterology

## 2014-02-28 ENCOUNTER — Ambulatory Visit (HOSPITAL_COMMUNITY)
Admission: RE | Admit: 2014-02-28 | Discharge: 2014-02-28 | Disposition: A | Discharge status: Home / Self Care | Payer: Federal, State, Local not specified - PPO | Source: Ambulatory Visit | Attending: Gastroenterology | Admitting: Gastroenterology

## 2014-02-28 ENCOUNTER — Encounter (HOSPITAL_COMMUNITY): Payer: Self-pay | Admitting: *Deleted

## 2014-02-28 DIAGNOSIS — R29898 Other symptoms and signs involving the musculoskeletal system: Secondary | ICD-10-CM | POA: Insufficient documentation

## 2014-02-28 DIAGNOSIS — Z8711 Personal history of peptic ulcer disease: Secondary | ICD-10-CM | POA: Insufficient documentation

## 2014-02-28 DIAGNOSIS — H269 Unspecified cataract: Secondary | ICD-10-CM | POA: Insufficient documentation

## 2014-02-28 DIAGNOSIS — K2960 Other gastritis without bleeding: Secondary | ICD-10-CM | POA: Insufficient documentation

## 2014-02-28 DIAGNOSIS — A048 Other specified bacterial intestinal infections: Secondary | ICD-10-CM | POA: Insufficient documentation

## 2014-02-28 DIAGNOSIS — M549 Dorsalgia, unspecified: Secondary | ICD-10-CM | POA: Insufficient documentation

## 2014-02-28 DIAGNOSIS — K259 Gastric ulcer, unspecified as acute or chronic, without hemorrhage or perforation: Secondary | ICD-10-CM

## 2014-02-28 DIAGNOSIS — G4730 Sleep apnea, unspecified: Secondary | ICD-10-CM | POA: Insufficient documentation

## 2014-02-28 DIAGNOSIS — Z79899 Other long term (current) drug therapy: Secondary | ICD-10-CM | POA: Insufficient documentation

## 2014-02-28 DIAGNOSIS — I10 Essential (primary) hypertension: Secondary | ICD-10-CM | POA: Insufficient documentation

## 2014-02-28 HISTORY — PX: ESOPHAGOGASTRODUODENOSCOPY: SHX5428

## 2014-02-28 HISTORY — DX: Gastric ulcer, unspecified as acute or chronic, without hemorrhage or perforation: K25.9

## 2014-02-28 SURGERY — EGD (ESOPHAGOGASTRODUODENOSCOPY)
Anesthesia: Moderate Sedation

## 2014-02-28 MED ORDER — LACTATED RINGERS IV SOLN: INTRAVENOUS | Status: DC

## 2014-02-28 MED ORDER — BUTAMBEN-TETRACAINE-BENZOCAINE 2-2-14 % EX AERO
INHALATION_SPRAY | CUTANEOUS | Status: DC | PRN
  Administered 2014-02-28: 2 via TOPICAL

## 2014-02-28 MED ORDER — DIPHENHYDRAMINE HCL 50 MG/ML IJ SOLN
INTRAMUSCULAR | Status: AC
  Filled 2014-02-28: qty 1

## 2014-02-28 MED ORDER — FENTANYL CITRATE 0.05 MG/ML IJ SOLN
INTRAMUSCULAR | Status: DC | PRN
  Administered 2014-02-28 (×3): 25 ug via INTRAVENOUS

## 2014-02-28 MED ORDER — SODIUM CHLORIDE 0.9 % IV SOLN: INTRAVENOUS | Status: DC

## 2014-02-28 MED ORDER — MIDAZOLAM HCL 10 MG/2ML IJ SOLN
INTRAMUSCULAR | Status: DC | PRN
  Administered 2014-02-28 (×3): 2.5 mg via INTRAVENOUS

## 2014-02-28 MED ORDER — FENTANYL CITRATE 0.05 MG/ML IJ SOLN
INTRAMUSCULAR | Status: AC
  Filled 2014-02-28: qty 2

## 2014-02-28 MED ORDER — MIDAZOLAM HCL 10 MG/2ML IJ SOLN
INTRAMUSCULAR | Status: AC
  Filled 2014-02-28: qty 2

## 2014-02-28 NOTE — Interval H&P Note (Signed)
History and Physical Interval Note:  02/28/2014 7:38 AM  Gary George  has presented today for surgery, with the diagnosis of Gastric ulcer [531.90]  The various methods of treatment have been discussed with the patient and family. After consideration of risks, benefits and other options for treatment, the patient has consented to  Procedure(s): ESOPHAGOGASTRODUODENOSCOPY (EGD) (N/A) as a surgical intervention .  The patient's history has been reviewed, patient examined, no change in status, stable for surgery.  I have reviewed the patient's chart and labs.  Questions were answered to the patient's satisfaction.     Rachael FeeJacobs, Meliah Appleman P

## 2014-02-28 NOTE — H&P (View-Only) (Signed)
Review of pertinent gastrointestinal problems: 1. NSAID+ H. Pylori + Gastric ulcers; led to melena, anemia; EGD [name] 12/2013 in-ptThere were three ulcers in pre-pyrloric stomach. Two were small (2-107mm across), shallow and clean based. One was larger (6mm across), contained a red pigmented but flat spot. The mucosa in the region of the ulcers was edematous but not clearly neoplastic. There was mild, distal gastritis as well, this was biopsied and sent to pathology. There was no blood in UGI tract. The examination was otherwise normal.  Treated with appropriate antibiotics and avoiding NSAIDs (had been taking alleve and another NSAID).   HPI: This is a  very pleasant morbidly obese 63 year old man whom I last saw about a month ago when he was hospitalized with a bleeding gastric ulcer.  He has been on tylenol since ulcer diagnosed. Doesn't work as well for his back pains.  Has been taking ppi bid since ulcer (was on none before).  Has been taking iron daily.  Blood counts yesterday showed hemoglobin of 8.8 which is better than when he was discharged but obviously not yet back to normal.    Past Medical History  Diagnosis Date  . Sleep apnea   . Hypertension   . Cataract     Right eye   . Obesity   . Back pain   . Left arm weakness     r/t prior broken bone   . Upper GI bleed     Past Surgical History  Procedure Laterality Date  . Esophagogastroduodenoscopy N/A 01/06/2014    Procedure: ESOPHAGOGASTRODUODENOSCOPY (EGD);  Surgeon: Gary George, Gary George;  Location: [facility] ENDOSCOPY;  Service: Endoscopy;  Laterality: N/A;    Current Outpatient Prescriptions  Medication Sig Dispense Refill  . amLODipine (NORVASC) 10 MG tablet Take 10 mg by mouth daily.      Marland Kitchen atorvastatin (LIPITOR) 20 MG tablet Take 10 mg by mouth at bedtime.       . cloNIDine (CATAPRES) 0.1 MG tablet Take 0.1 mg by mouth 2 (two) times daily.      . hydrochlorothiazide (HYDRODIURIL) 25 MG tablet Take 25 mg by mouth daily.       Marland Kitchen lisinopril (PRINIVIL,ZESTRIL) 40 MG tablet Take 40 mg by mouth daily.      . multivitamin-iron-minerals-folic acid (CENTRUM) chewable tablet Chew 1 tablet by mouth daily.  30 tablet  5  . pantoprazole (PROTONIX) 40 MG tablet Take 1 tablet (40 mg total) by mouth 2 (two) times daily before a meal.  60 tablet  0   No current facility-administered medications for this visit.    Allergies as of 02/05/2014  . (No Known Allergies)    Family History  Problem Relation Age of Onset  . Aneurysm Mother     History   Social History  . Marital Status: Unknown    Spouse Name: N/A    Number of Children: N/A  . Years of Education: N/A   Occupational History  . Not on file.   Social History Main Topics  . Smoking status: Never Smoker   . Smokeless tobacco: Never Used  . Alcohol Use: No  . Drug Use: Not on file  . Sexual Activity: Not on file   Other Topics Concern  . Not on file   Social History Narrative  . No narrative on file      Physical Exam: BP 120/70  Pulse 70  Ht 5\' 7"  (1.702 m)  Wt 351 lb (159.213 kg)  BMI 54.96 kg/m2 Constitutional: generally well-appearing except  for morbid obesity Psychiatric: alert and oriented x3 Abdomen: soft, nontender, nondistended, no obvious ascites, no peritoneal signs, normal bowel sounds     Assessment and plan: 63 y.o. male with NSAID, H. pylori ulcer in his stomach one month ago  Tylenol for his back pain is really not controlling his pain very well. I think it is probably safe that he resumed a single dose of Aleve once daily. He ulcer was in part from H. pylori that was proven by biopsy. He will also stay on proton pump inhibitor for as long as he requires NSAID pain medicine which is probably going to be indefinite period he needs repeat EGD confirm ulcer healing in about one month's time. I think it is safe to cut back to once daily proton pump inhibitor for now.

## 2014-02-28 NOTE — Op Note (Signed)
[facility] [address] [place], [id number]   ENDOSCOPY PROCEDURE REPORT  PATIENT: Gary George, [name]  MR#: [id number] BIRTHDATE: [birth year] , 62  yrs. old GENDER: Male ENDOSCOPIST: Rachael Feeaniel P Jacobs, MD PROCEDURE DATE:  02/28/2014 PROCEDURE:  EGD, diagnostic ASA CLASS:     Class II INDICATIONS:  NSAID+ H.  Pylori + Gastric ulcers; led to melena, anemia; EGD Christella HartiganJacobs 12/2013 in-ptThere were three ulcers in pre-pyrloric stomach.  Two were small (2-693mm across), shallow and clean based.  One was larger (6mm across), contained a red pigmented but flat spot.  The mucosa in the region of the ulcers was edematous but not clearly neoplastic.  There was mild, distal gastritis as well, this was biopsied and sent to pathology.  There was no blood in UGI tract.  The examination was otherwise normal. Treated with appropriate antibiotics and avoiding NSAIDs (had been taking alleve and another NSAID).Marland Kitchen. MEDICATIONS: Fentanyl 75 mcg IV and Versed-Detailed 7.5 mg IV TOPICAL ANESTHETIC: Cetacaine Spray  DESCRIPTION OF PROCEDURE: After the risks benefits and alternatives of the procedure were thoroughly explained, informed consent was obtained.  The Pentax Gastroscope [id number] endoscope was introduced through the mouth and advanced to the second portion of the duodenum. Without limitations.  The instrument was slowly withdrawn as the mucosa was fully examined.     The previously noted distal gastric ulcers have completely healed. There is mild gastritis remaining in the antrum only.  The examination was otherwise normal.  Retroflexed views revealed no abnormalities.     The scope was then withdrawn from the patient and the procedure completed. COMPLICATIONS: There were no complications.  ENDOSCOPIC IMPRESSION: The previously noted distal gastric ulcers have completely healed. There is mild gastritis remaining in the antrum only. The examination was otherwise  normal.  RECOMMENDATIONS: You should continue to avoid NSAIDs as best that you can and take once daily PPI (protonix).   eSigned:  Rachael Feeaniel P Jacobs, MD 02/28/2014 8:05 AM   CC: Hazeline Junkeryan Grunz, MD

## 2014-02-28 NOTE — Discharge Instructions (Addendum)

## 2014-03-04 ENCOUNTER — Encounter (HOSPITAL_COMMUNITY): Payer: Self-pay | Admitting: Gastroenterology

## 2014-03-20 ENCOUNTER — Telehealth: Payer: Self-pay | Admitting: Family Medicine

## 2014-03-20 NOTE — Telephone Encounter (Signed)
Pt called and needs the doctor to call the pharmacy to give permission to refill his Prontonix. jw

## 2014-03-22 NOTE — Telephone Encounter (Signed)
Recieved prior authorization request from [employer] [employer]. Contacted company and obtained approval for dates Jan 22 2014 - MARCH 2016. Called [facility] and notified them of the authorization for the dates stated above. Shirlean SchleinAriel Jones, SMA

## 2014-08-19 ENCOUNTER — Other Ambulatory Visit: Payer: Self-pay | Admitting: *Deleted

## 2014-08-19 NOTE — Telephone Encounter (Signed)
Requesting 90 day supply.  Clovis Pu, RN

## 2014-08-20 MED ORDER — PANTOPRAZOLE SODIUM 40 MG PO TBEC: 40.0000 mg | DELAYED_RELEASE_TABLET | Freq: Every day | ORAL | Status: DC

## 2015-08-15 ENCOUNTER — Telehealth: Payer: Self-pay | Admitting: *Deleted

## 2015-08-15 NOTE — Telephone Encounter (Signed)
[facility] pant[facility]. PA completed online at [email or web address].  PA approved until 08/14/16.  [facility] of [facility] Key: [id number].  Clovis Pu, RN

## 2015-10-29 ENCOUNTER — Other Ambulatory Visit: Payer: Self-pay | Admitting: Family Medicine

## 2015-12-03 ENCOUNTER — Emergency Department (INDEPENDENT_AMBULATORY_CARE_PROVIDER_SITE_OTHER): Payer: Federal, State, Local not specified - PPO

## 2015-12-03 ENCOUNTER — Encounter (HOSPITAL_COMMUNITY): Payer: Self-pay | Admitting: Emergency Medicine

## 2015-12-03 ENCOUNTER — Emergency Department (HOSPITAL_COMMUNITY)
Admission: EM | Admit: 2015-12-03 | Discharge: 2015-12-03 | Disposition: A | Discharge status: Home / Self Care | Payer: Federal, State, Local not specified - PPO | Source: Home / Self Care

## 2015-12-03 DIAGNOSIS — S86811A Strain of other muscle(s) and tendon(s) at lower leg level, right leg, initial encounter: Secondary | ICD-10-CM

## 2015-12-03 DIAGNOSIS — S86911A Strain of unspecified muscle(s) and tendon(s) at lower leg level, right leg, initial encounter: Secondary | ICD-10-CM

## 2015-12-03 MED ORDER — TRAMADOL HCL 50 MG PO TABS: 50.0000 mg | ORAL_TABLET | Freq: Four times a day (QID) | ORAL | Status: DC | PRN

## 2015-12-03 NOTE — ED Notes (Addendum)
Right knee, onset 2 weeks ago.  Denies injury.  Patient concerned for repetitive motion.

## 2015-12-03 NOTE — ED Provider Notes (Signed)
CSN: [id number]     Arrival date & time 12/03/15  1548 History   None    Chief Complaint  Patient presents with  . Knee Pain   (Consider location/radiation/quality/duration/timing/severity/associated sxs/prior Treatment) HPI History obtained from patient:   LOCATION: right knee SEVERITY: 5 DURATION: Several weeks CONTEXT: Getting in and out of a truck climbing. QUALITY: Toothache MODIFYING FACTORS: Ibuprofen and heat ASSOCIATED SYMPTOMS: Nothing TIMING: Episodic OCCUPATION: Games developer  Past Medical History  Diagnosis Date  . Sleep apnea   . Hypertension   . Cataract     Right eye   . Obesity   . Back pain   . Left arm weakness     r/t prior broken bone   . Upper GI bleed   . Gastric ulcer     X3    Past Surgical History  Procedure Laterality Date  . Esophagogastroduodenoscopy N/A 01/06/2014    Procedure: ESOPHAGOGASTRODUODENOSCOPY (EGD);  Surgeon: Rachael Fee, MD;  Location: [facility] ENDOSCOPY;  Service: Endoscopy;  Laterality: N/A;  . Esophagogastroduodenoscopy N/A 02/28/2014    Procedure: ESOPHAGOGASTRODUODENOSCOPY (EGD);  Surgeon: Rachael Fee, MD;  Location: [facility];  Service: Endoscopy;  Laterality: N/A;   Family History  Problem Relation Age of Onset  . Aneurysm Mother    Social History  Substance Use Topics  . Smoking status: Never Smoker   . Smokeless tobacco: Never Used  . Alcohol Use: No    Review of Systems ROS +'ve right knee pain  Denies: HEADACHE, NAUSEA, ABDOMINAL PAIN, CHEST PAIN, CONGESTION, DYSURIA, SHORTNESS OF BREATH  Allergies  Review of patient's allergies indicates no known allergies.  Home Medications   Prior to Admission medications   Medication Sig Start Date End Date Taking? Authorizing Provider  amLODipine (NORVASC) 10 MG tablet Take 10 mg by mouth daily.    Historical Provider, MD  atorvastatin (LIPITOR) 20 MG tablet Take 10 mg by mouth at bedtime.  11/27/13   Historical Provider, MD  cloNIDine (CATAPRES) 0.1 MG  tablet Take 0.1 mg by mouth 2 (two) times daily.    Historical Provider, MD  hydrochlorothiazide (HYDRODIURIL) 25 MG tablet Take 25 mg by mouth daily.    Historical Provider, MD  lisinopril (PRINIVIL,ZESTRIL) 40 MG tablet Take 40 mg by mouth daily.    Historical Provider, MD  multivitamin-iron-minerals-folic acid (CENTRUM) chewable tablet Chew 1 tablet by mouth daily. 01/16/14   Tyrone Nine, MD  pantoprazole (PROTONIX) 40 MG tablet TAKE 1 TABLET (40 MG TOTAL) BY MOUTH DAILY. 10/30/15   Tyrone Nine, MD  traMADol (ULTRAM) 50 MG tablet Take 1 tablet (50 mg total) by mouth every 6 (six) hours as needed. 12/03/15   Tharon Aquas, PA   Meds Ordered and Administered this Visit  Medications - No data to display  BP 143/76 mmHg  Pulse 95  Temp(Src) 98.1 F (36.7 C) (Oral)  SpO2 98% No data found.   Physical Exam  Constitutional: He appears well-developed and well-nourished.  Musculoskeletal: He exhibits tenderness.       Right knee: He exhibits no bony tenderness, normal meniscus and no MCL laxity. Tenderness found. Medial joint line tenderness noted. No MCL and no patellar tendon tenderness noted.       Legs: Nursing note and vitals reviewed.   ED Course  Procedures (including critical care time)  Labs Review Labs Reviewed - No data to display  Imaging Review Dg Knee Complete 4 Views Right  12/03/2015  CLINICAL DATA:  Right knee pain for 2  weeks, pain medial to patella EXAM: RIGHT KNEE - COMPLETE 4+ VIEW COMPARISON:  None. FINDINGS: Four views of the right knee submitted. No acute fracture or subluxation. Minimal narrowing of medial joint compartment. No joint effusion. IMPRESSION: No acute fracture or subluxation. Minimal narrowing of medial joint compartment. Electronically Signed   By: Natasha MeadLiviu  Pop M.D.   On: 12/03/2015 17:42     Visual Acuity Review  Right Eye Distance:   Left Eye Distance:   Bilateral Distance:    Right Eye Near:   Left Eye Near:    Bilateral Near:          MDM   1. Knee strain, right, initial encounter    Discussion with patient the review of his x-ray. Although it does not appear apparent at this time that he is ready for the artificial knee replacement and he may be heading in that direction in light of the fact of his obesity and the wearing of his knee there is narrowing of the medial aspect of the joint. Patient is advised to continue symptomatic treatment. There is not a knee immobilizer that is large enough for his leg. Patient has been advised of this. A return to work note has been provided for the patient also. Instructions of care provided discharged home in stable condition.  THIS NOTE WAS GENERATED USING A VOICE RECOGNITION SOFTWARE PROGRAM. ALL REASONABLE EFFORTS  WERE MADE TO PROOFREAD THIS DOCUMENT FOR ACCURACY.     Tharon AquasFrank C Jaymien Landin, PA 12/03/15 [id number]

## 2015-12-03 NOTE — Discharge Instructions (Signed)
Knee Sprain °A knee sprain is a tear in the strong bands of tissue that connect the bones (ligaments) of your knee. °HOME CARE °· Raise (elevate) your injured knee to lessen puffiness (swelling). °· To ease pain and puffiness, put ice on the injured area. °¨ Put ice in a plastic bag. °¨ Place a towel between your skin and the bag. °¨ Leave the ice on for 20 minutes, 2-3 times a day. °· Only take medicine as told by your doctor. °· Do not leave your knee unprotected until pain and stiffness go away (usually 4-6 weeks). °· If you have a cast or splint, do not get it wet. If your doctor told you to not take it off, cover it with a plastic bag when you shower or bathe. Do not swim. °· Your doctor may have you do exercises to prevent or limit permanent weakness and stiffness. °GET HELP RIGHT AWAY IF:  °· Your cast or splint becomes damaged. °· Your pain gets worse. °· You have a lot of pain, puffiness, or numbness below the cast or splint. °MAKE SURE YOU:  °· Understand these instructions. °· Will watch your condition. °· Will get help right away if you are not doing well or get worse. °  °This information is not intended to replace advice given to you by your health care provider. Make sure you discuss any questions you have with your health care provider. °  °Document Released: 12/01/2009 Document Revised: 12/18/2013 Document Reviewed: 08/21/2013 °Elsevier Interactive Patient Education ©2016 Elsevier Inc. ° °

## 2015-12-09 ENCOUNTER — Ambulatory Visit (INDEPENDENT_AMBULATORY_CARE_PROVIDER_SITE_OTHER): Payer: Federal, State, Local not specified - PPO | Admitting: Family Medicine

## 2015-12-09 ENCOUNTER — Encounter: Payer: Self-pay | Admitting: Family Medicine

## 2015-12-09 VITALS — BP 130/82 | HR 64 | Temp 97.9°F | Wt 357.2 lb

## 2015-12-09 DIAGNOSIS — M1711 Unilateral primary osteoarthritis, right knee: Secondary | ICD-10-CM

## 2015-12-09 MED ORDER — DICLOFENAC SODIUM 1 % TD GEL: 2.0000 g | Freq: Four times a day (QID) | TRANSDERMAL | Status: AC | PRN

## 2015-12-09 MED ORDER — MELOXICAM 15 MG PO TABS: 15.0000 mg | ORAL_TABLET | Freq: Every day | ORAL | Status: DC

## 2015-12-09 NOTE — Assessment & Plan Note (Addendum)
Right knee osteoarthritis: XR's showing mild medial narrowing, these were reviewed with pt today.  - Reviewed the primary treatment of continued movement and weight loss (357lbs today).  - NSAIDs (takes PPI) and topical analgesic PRN  - Defer referral to PT for movement/muscle strengthening. Consider injection if unimproved in 1 month.

## 2015-12-09 NOTE — Progress Notes (Signed)
Subjective: Gary George is a 64 y.o. male presenting for knee pain.  He reports moderate, constant, nonradiating right knee pain unchanged over the past several weeks. 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 Tramadol has been ineffective, aleve helped somewhat as did icy/hot at night. XR's reviewed from that visit show medial compartment narrowing without fracture/dislocations, significant osteophytes or subchondral changes. Has not had injections and is reluctant to try this or PT at this time.  - ROS: Denies injury, fever, hip or left leg pain, knee popping, locking, giving out. Reports short-lived (< 15 min) stiffness when waking or arising from long sitting.  - Non-smoker  Objective: BP 130/82 mmHg  Pulse 64  Temp(Src) 97.9 F (36.6 C) (Oral)  Wt 357 lb 3.2 oz (162.025 kg)  SpO2 97% Gen: Obese, pleasant 64 y.o. male in no distress Knee: No erythema or obvious bony abnormalities. + mild effusion. + medial joint line tenderness. No warmth, patellar tenderness, or condyle tenderness. ROM full in flexion and extension. Ligaments with solid consistent endpoints including ACL, PCL, LCL, MCL. Negative Mcmurray's, Apley's, and Thessaly tests. Non painful patellar compression. Patellar glide with crepitus. Patellar and quadriceps tendons unremarkable. Hamstring and quadriceps strength is normal.   Assessment/Plan: Gary George is a 64 y.o. male here for right knee pain.  Osteoarthritis of right knee Right knee osteoarthritis: XR's showing mild medial narrowing, these were reviewed with pt today.  - Reviewed the primary treatment of continued movement and weight loss (357lbs today).  - NSAIDs (takes PPI) and topical analgesic PRN  - Defer referral to PT for movement/muscle strengthening. Consider injection if unimproved in 1 month.

## 2015-12-09 NOTE — Patient Instructions (Addendum)
The treatment for OA is continuing activity, pain relief in the form of both pills and topical gels, and icy hot, and ice as well as weight loss.   START taking mobic once a day for inflammation, everyday Apply voltaren gel up to 4 times per day.  - Follow up in 1 month if these aren't helping and we can discuss other options.   Osteoarthritis Osteoarthritis is a disease that causes soreness and inflammation of a joint. It occurs when the cartilage at the affected joint wears down. Cartilage acts as a cushion, covering the ends of bones where they meet to form a joint. Osteoarthritis is the most common form of arthritis. It often occurs in older people. The joints affected most often by this condition include those in the:  Ends of the fingers.  Thumbs.  Neck.  Lower back.  Knees.  Hips. CAUSES  Over time, the cartilage that covers the ends of bones begins to wear away. This causes bone to rub on bone, producing pain and stiffness in the affected joints.  RISK FACTORS Certain factors can increase your chances of having osteoarthritis, including:  Older age.  Excessive body weight.  Overuse of joints.  Previous joint injury. SIGNS AND SYMPTOMS   Pain, swelling, and stiffness in the joint.  Over time, the joint may lose its normal shape.  Small deposits of bone (osteophytes) may grow on the edges of the joint.  Bits of bone or cartilage can break off and float inside the joint space. This may cause more pain and damage. DIAGNOSIS  Your health care provider will do a physical exam and ask about your symptoms. Various tests may be ordered, such as:  X-rays of the affected joint.  Blood tests to rule out other types of arthritis. Additional tests may be used to diagnose your condition. TREATMENT  Goals of treatment are to control pain and improve joint function. Treatment plans may include:  A prescribed exercise program that allows for rest and joint relief.  A weight  control plan.  Pain relief techniques, such as:  Properly applied heat and cold.  Electric pulses delivered to nerve endings under the skin (transcutaneous electrical nerve stimulation [TENS]).  Massage.  Certain nutritional supplements.  Medicines to control pain, such as:  Acetaminophen.  Nonsteroidal anti-inflammatory drugs (NSAIDs), such as naproxen.  Narcotic or central-acting agents, such as tramadol.  Corticosteroids. These can be given orally or as an injection.  Surgery to reposition the bones and relieve pain (osteotomy) or to remove loose pieces of bone and cartilage. Joint replacement may be needed in advanced states of osteoarthritis. HOME CARE INSTRUCTIONS   Take medicines only as directed by your health care provider.  Maintain a healthy weight. Follow your health care provider's instructions for weight control. This may include dietary instructions.  Exercise as directed. Your health care provider can recommend specific types of exercise. These may include:  Strengthening exercises. These are done to strengthen the muscles that support joints affected by arthritis. They can be performed with weights or with exercise bands to add resistance.  Aerobic activities. These are exercises, such as brisk walking or low-impact aerobics, that get your heart pumping.  Range-of-motion activities. These keep your joints limber.  Balance and agility exercises. These help you maintain daily living skills.  Rest your affected joints as directed by your health care provider.  Keep all follow-up visits as directed by your health care provider. SEEK MEDICAL CARE IF:   Your skin turns red.  You develop a rash in addition to your joint pain.  You have worsening joint pain.  You have a fever along with joint or muscle aches. SEEK IMMEDIATE MEDICAL CARE IF:  You have a significant loss of weight or appetite.  You have night sweats. FOR MORE INFORMATION   National  Institute of Arthritis and Musculoskeletal and Skin Diseases: www.niams.http://www.myers.net/  [employer] on Aging: https://walker.com/  American College of Rheumatology: www.rheumatology.org   This information is not intended to replace advice given to you by your health care provider. Make sure you discuss any questions you have with your health care provider.   Document Released: 12/13/2005 Document Revised: 01/03/2015 Document Reviewed: 08/20/2013 Elsevier Interactive Patient Education [employer]

## 2016-01-04 ENCOUNTER — Other Ambulatory Visit: Payer: Self-pay | Admitting: Family Medicine

## 2016-02-19 ENCOUNTER — Other Ambulatory Visit: Payer: Self-pay | Admitting: Family Medicine

## 2016-03-17 ENCOUNTER — Other Ambulatory Visit: Payer: Self-pay | Admitting: Family Medicine

## 2016-04-27 ENCOUNTER — Other Ambulatory Visit: Payer: Self-pay | Admitting: Family Medicine

## 2016-06-04 ENCOUNTER — Other Ambulatory Visit: Payer: Self-pay | Admitting: Family Medicine

## 2016-07-05 ENCOUNTER — Other Ambulatory Visit: Payer: Self-pay | Admitting: *Deleted

## 2016-07-06 MED ORDER — MELOXICAM 15 MG PO TABS: ORAL_TABLET | ORAL | Status: DC

## 2016-07-17 IMAGING — DX DG KNEE COMPLETE 4+V*R*
4 series · 4 of 4 positions shown · non-contrast
Comparison: None.

CLINICAL DATA: Right knee pain for 2 weeks, pain medial to patella

EXAM:
RIGHT KNEE - COMPLETE 4+ VIEW

[knee ap]
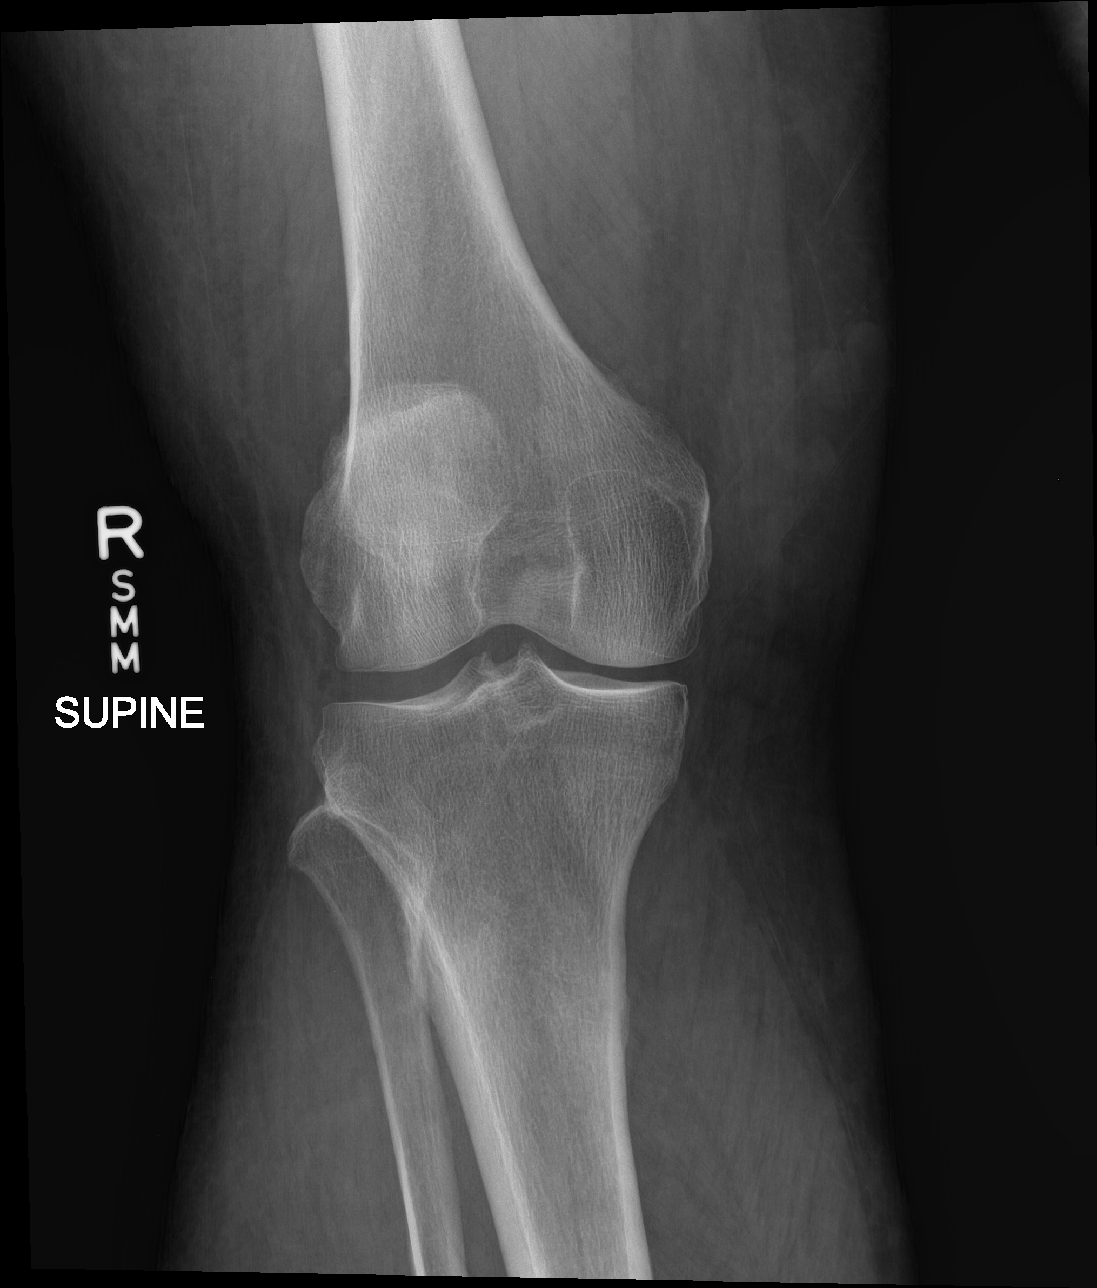

[knee obl (1 of 2)]
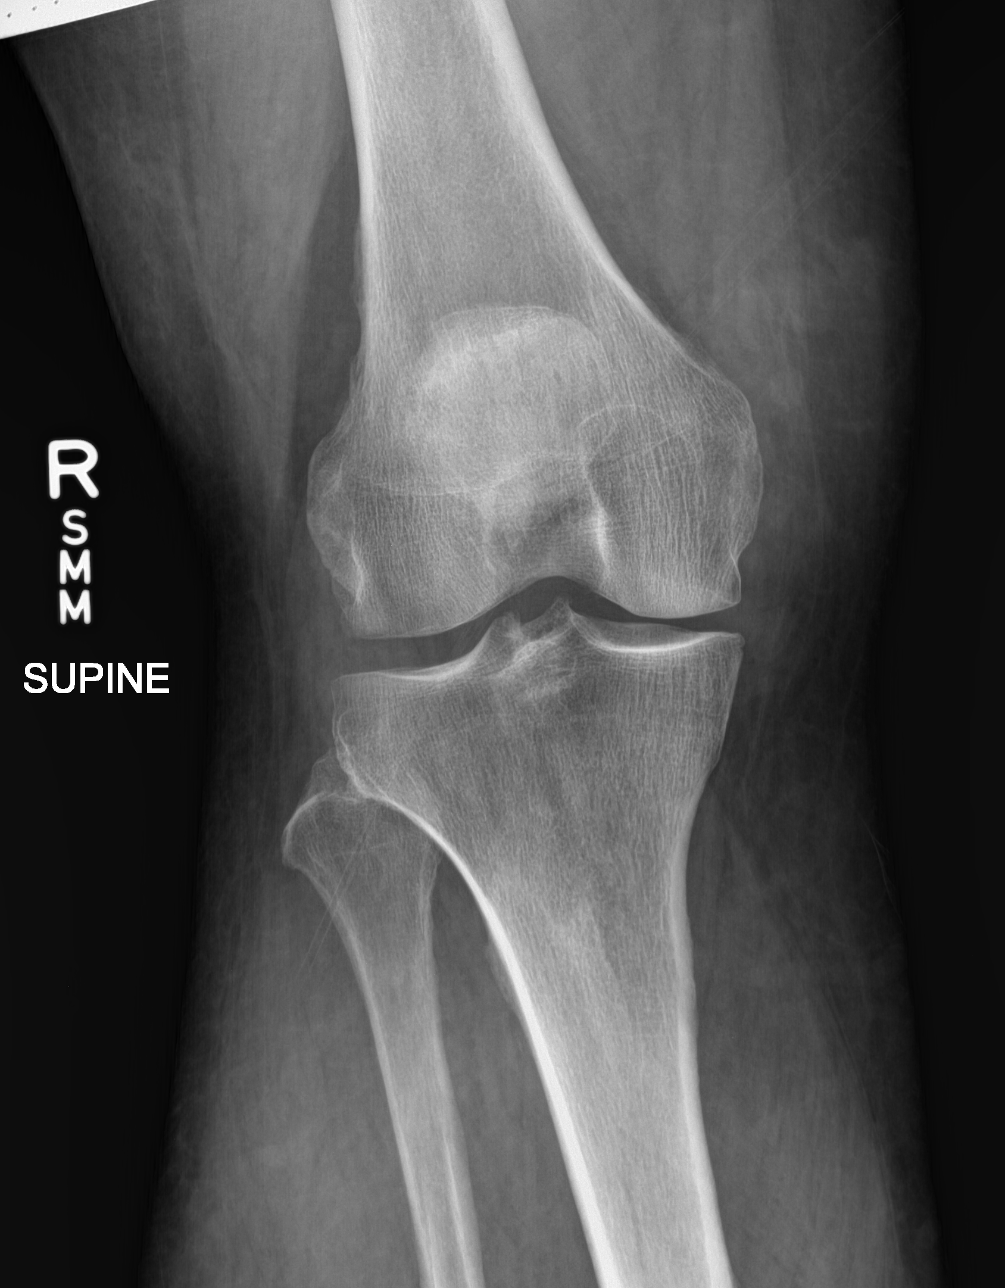

[knee obl (2 of 2)]
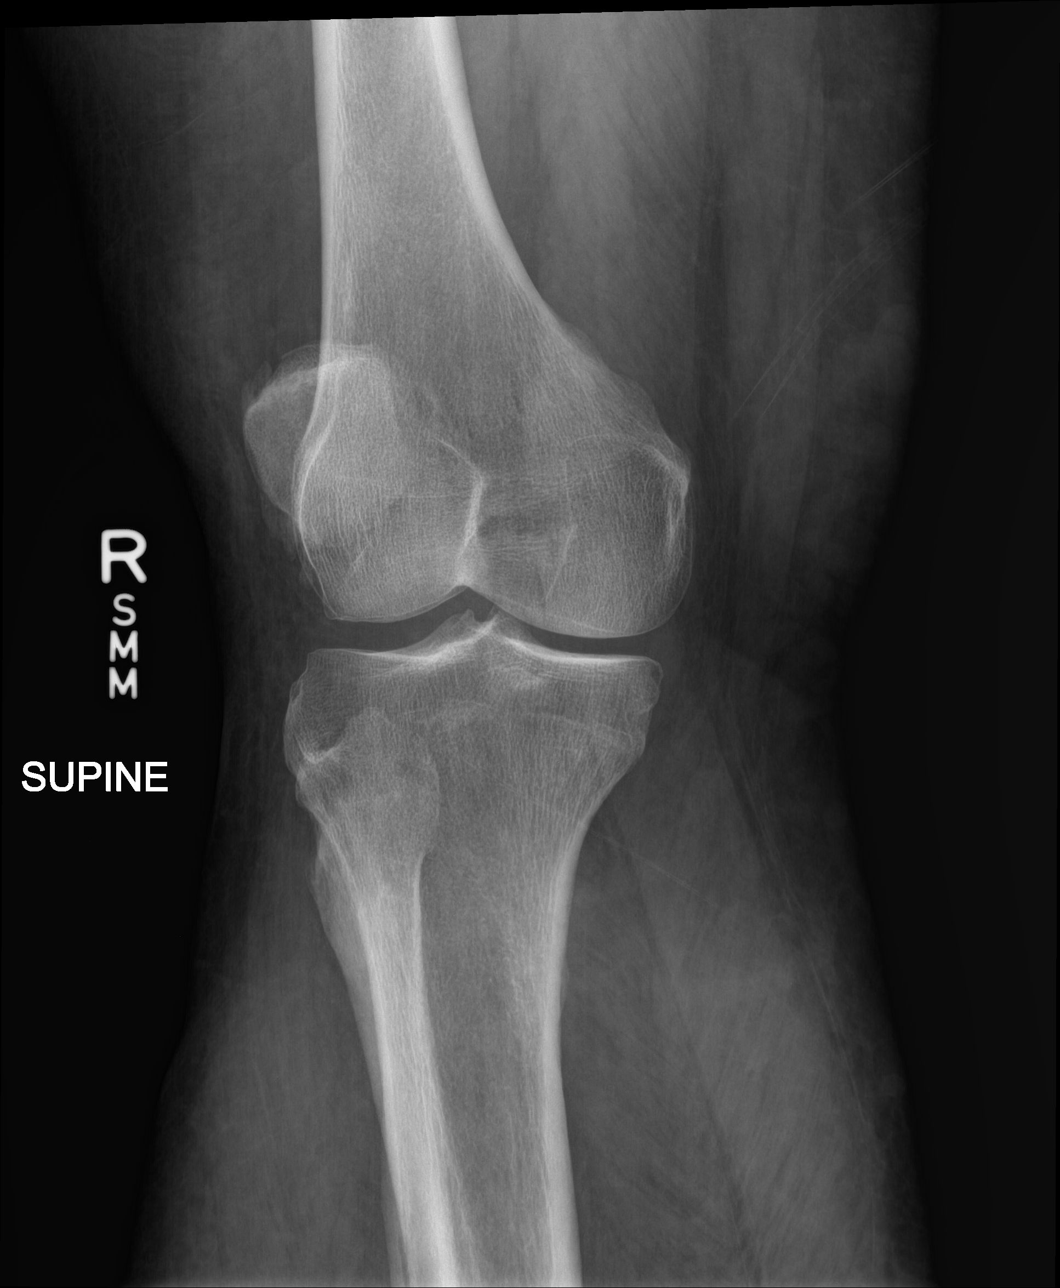

[knee lat]
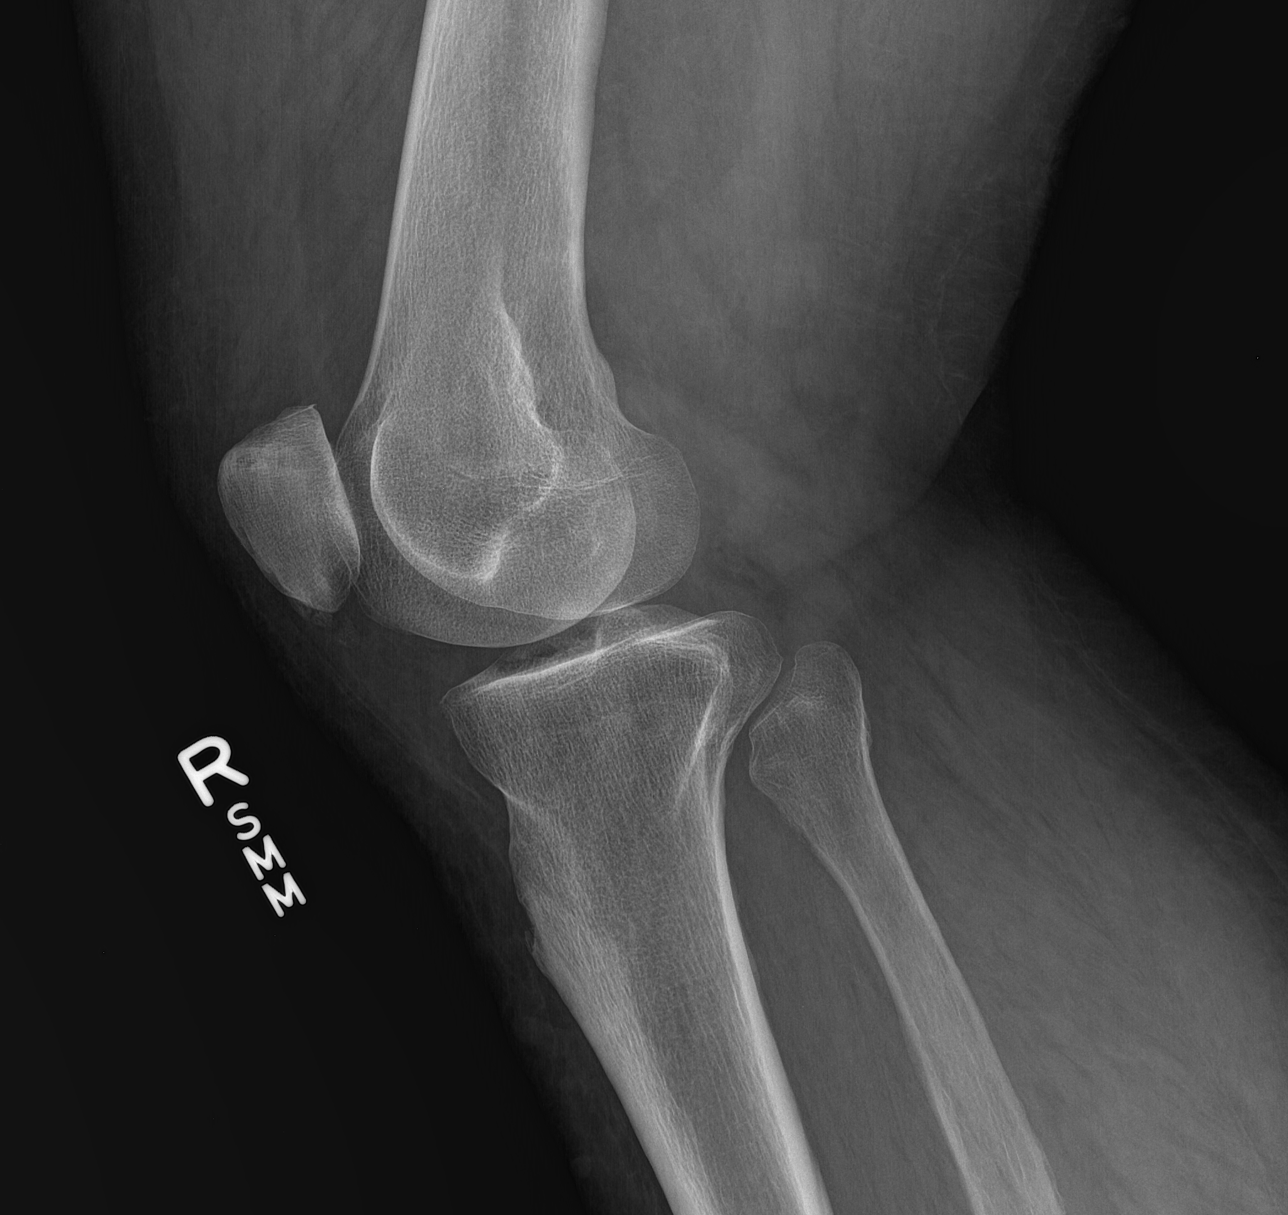

[4 of 4 positions shown; findings below may reference images not displayed]

FINDINGS: Four views of the right knee submitted. No acute fracture or
subluxation. Minimal narrowing of medial joint compartment. No joint
effusion.
IMPRESSION: No acute fracture or subluxation. Minimal narrowing of medial joint
compartment.

## 2016-08-31 ENCOUNTER — Other Ambulatory Visit: Payer: Self-pay | Admitting: *Deleted

## 2016-08-31 MED ORDER — MELOXICAM 15 MG PO TABS: ORAL_TABLET | ORAL | 2 refills | Status: DC

## 2016-08-31 NOTE — Telephone Encounter (Signed)
Refill request for 90 day supply.  Martin, Tamika L, RN  

## 2016-12-26 ENCOUNTER — Other Ambulatory Visit: Payer: Self-pay | Admitting: Family Medicine

## 2017-01-27 ENCOUNTER — Other Ambulatory Visit: Payer: Self-pay | Admitting: Family Medicine

## 2017-02-02 ENCOUNTER — Other Ambulatory Visit: Payer: Self-pay | Admitting: Family Medicine

## 2017-02-10 ENCOUNTER — Other Ambulatory Visit: Payer: Self-pay | Admitting: Family Medicine

## 2018-09-26 ENCOUNTER — Telehealth: Payer: Self-pay | Admitting: Student in an Organized Health Care Education/Training Program

## 2018-09-26 NOTE — Telephone Encounter (Signed)
Contacted pt. Left voicemail asking them to call back to schedule health maintenance appointment. CH

## 2020-11-01 ENCOUNTER — Encounter (HOSPITAL_COMMUNITY): Payer: Self-pay | Admitting: *Deleted

## 2020-11-01 ENCOUNTER — Ambulatory Visit (HOSPITAL_COMMUNITY)
Admission: EM | Admit: 2020-11-01 | Discharge: 2020-11-01 | Disposition: A | Discharge status: Home / Self Care | Payer: Medicare Other | Attending: Emergency Medicine | Admitting: Emergency Medicine

## 2020-11-01 ENCOUNTER — Other Ambulatory Visit: Payer: Self-pay

## 2020-11-01 DIAGNOSIS — M5441 Lumbago with sciatica, right side: Secondary | ICD-10-CM | POA: Diagnosis not present

## 2020-11-01 MED ORDER — METHOCARBAMOL 500 MG PO TABS: 500.0000 mg | ORAL_TABLET | Freq: Two times a day (BID) | ORAL | 0 refills | Status: DC

## 2020-11-01 NOTE — ED Provider Notes (Signed)
[facility]    CSN: [id number] Arrival date & time: 11/01/20  1002      History   Chief Complaint Chief Complaint  Patient presents with  . Back Pain    HPI Gary George is a 69 y.o. male.   Presents with 2-day history of right lower back pain.  No falls or injury.  The pain occasionally radiates down his right leg.  He describes the pain as aching, 8/10, intermittent, worse with movements and weight bearing.  He denies numbness or weakness.  Treatment attempted at home with Biofreeze and topical pain reliever.  His medical history includes back pain, osteoarthritis of right knee, hypertension, sleep apnea, morbid obesity, upper GI bleed, gastric ulcer.  The history is provided by the patient and medical records.    Past Medical History:  Diagnosis Date  . Back pain   . Cataract    Right eye   . Gastric ulcer    X3   . Hypertension   . Left arm weakness    r/t prior broken bone   . Obesity   . Sleep apnea   . Upper GI bleed     Patient Active Problem List   Diagnosis Date Noted  . Osteoarthritis of right knee 12/09/2015  . Back pain 01/16/2014  . Helicobacter pylori gastritis 01/16/2014  . Gastric ulcer 01/06/2014  . OSA on CPAP 01/05/2014  . HTN (hypertension) 01/05/2014  . Morbid obesity (HCC) 01/05/2014    Past Surgical History:  Procedure Laterality Date  . ESOPHAGOGASTRODUODENOSCOPY N/A 01/06/2014   Procedure: ESOPHAGOGASTRODUODENOSCOPY (EGD);  Surgeon: Rachael Fee, MD;  Location: [facility] ENDOSCOPY;  Service: Endoscopy;  Laterality: N/A;  . ESOPHAGOGASTRODUODENOSCOPY N/A 02/28/2014   Procedure: ESOPHAGOGASTRODUODENOSCOPY (EGD);  Surgeon: Rachael Fee, MD;  Location: [facility];  Service: Endoscopy;  Laterality: N/A;       Home Medications    Prior to Admission medications   Medication Sig Start Date End Date Taking? Authorizing Provider  amLODipine (NORVASC) 10 MG tablet Take 10 mg by mouth daily.   Yes [provider]    atorvastatin (LIPITOR) 20 MG tablet Take 10 mg by mouth at bedtime.  11/27/13  Yes [provider]  cloNIDine (CATAPRES) 0.1 MG tablet Take 0.1 mg by mouth 2 (two) times daily.   Yes [provider]  hydrochlorothiazide (HYDRODIURIL) 25 MG tablet Take 25 mg by mouth daily.   Yes [provider]  lisinopril (PRINIVIL,ZESTRIL) 40 MG tablet Take 40 mg by mouth daily.   Yes [provider]  multivitamin-iron-minerals-folic acid (CENTRUM) chewable tablet Chew 1 tablet by mouth daily. 01/16/14  Yes Tyrone Nine, MD  pantoprazole (PROTONIX) 40 MG tablet TAKE 1 TABLET (40 MG TOTAL) BY MOUTH DAILY. 10/30/15  Yes Tyrone Nine, MD  diclofenac sodium (VOLTAREN) 1 % GEL Apply 2 g topically 4 (four) times daily as needed (knee pain). 12/09/15   Tyrone Nine, MD  meloxicam (MOBIC) 15 MG tablet TAKE 1 TABLET BY MOUTH DAILY 12/30/16   Rumley, [place], DO  methocarbamol (ROBAXIN) 500 MG tablet Take 1 tablet (500 mg total) by mouth 2 (two) times daily. 11/01/20   Mickie Bail, NP    Family History Family History  Problem Relation Age of Onset  . Aneurysm Mother     Social History Social History   Tobacco Use  . Smoking status: Never Smoker  . Smokeless tobacco: Never Used  Substance Use Topics  . Alcohol use: No  .  Drug use: Not on file     Allergies   Patient has no known allergies.   Review of Systems Review of Systems  Constitutional: Negative for chills and fever.  HENT: Negative for ear pain and sore throat.   Eyes: Negative for pain and visual disturbance.  Respiratory: Negative for cough and shortness of breath.   Cardiovascular: Negative for chest pain and palpitations.  Gastrointestinal: Negative for abdominal pain and vomiting.  Genitourinary: Negative for dysuria and hematuria.  Musculoskeletal: Positive for back pain. Negative for arthralgias.  Skin: Negative for color change and rash.  Neurological: Negative for seizures, syncope, weakness  and numbness.  All other systems reviewed and are negative.    Physical Exam Triage Vital Signs ED Triage Vitals  Enc Vitals Group     BP --      Pulse --      Resp --      Temp --      Temp src --      SpO2 --      Weight 11/01/20 1021 (!) 365 lb (165.6 kg)     Height 11/01/20 1021 5\' 10"  (1.778 m)     Head Circumference --      Peak Flow --      Pain Score 11/01/20 1020 8     Pain Loc --      Pain Edu? --      Excl. in GC? --    No data found.  Updated Vital Signs BP 125/60 (BP Location: Right Arm)   Pulse 88   Temp 98.4 F (36.9 C) (Oral)   Resp 18   Ht 5\' 10"  (1.778 m)   Wt (!) 365 lb (165.6 kg)   SpO2 96%   BMI 52.37 kg/m   Visual Acuity Right Eye Distance:   Left Eye Distance:   Bilateral Distance:    Right Eye Near:   Left Eye Near:    Bilateral Near:     Physical Exam Vitals and nursing note reviewed.  Constitutional:      General: He is not in acute distress.    Appearance: He is well-developed. He is obese.  HENT:     Head: Normocephalic and atraumatic.     Mouth/Throat:     Mouth: Mucous membranes are moist.  Eyes:     Conjunctiva/sclera: Conjunctivae normal.  Cardiovascular:     Rate and Rhythm: Normal rate and regular rhythm.     Heart sounds: Normal heart sounds.  Pulmonary:     Effort: Pulmonary effort is normal. No respiratory distress.     Breath sounds: Normal breath sounds.  Abdominal:     Palpations: Abdomen is soft.     Tenderness: There is no abdominal tenderness. There is no right CVA tenderness, left CVA tenderness, guarding or rebound.  Musculoskeletal:        General: Tenderness present. No swelling, deformity or signs of injury. Normal range of motion.     Cervical back: Neck supple.       Back:  Skin:    General: Skin is warm and dry.     Findings: No bruising, erythema, lesion or rash.  Neurological:     General: No focal deficit present.     Mental Status: He is alert and oriented to person, place, and time.      Sensory: No sensory deficit.     Motor: No weakness.     Gait: Gait normal.  Psychiatric:  Mood and Affect: Mood normal.        Behavior: Behavior normal.      UC Treatments / Results  Labs (all labs ordered are listed, but only abnormal results are displayed) Labs Reviewed - No data to display  EKG   Radiology No results found.  Procedures Procedures (including critical care time)  Medications Ordered in UC Medications - No data to display  Initial Impression / Assessment and Plan / UC Course  I have reviewed the triage vital signs and the nursing notes.  Pertinent labs & imaging results that were available during my care of the patient were reviewed by me and considered in my medical decision making (see chart for details).   Acute low back pain with right side sciatica.  Patient is unable to take NSAIDs due to his history of GI bleed.  Treating with Robaxin; precautions for drowsiness discussed.  Suggested Tylenol and warm compresses as needed for discomfort.  Instructed patient to follow-up with his PCP or an orthopedist if his pain is not improving.  Patient agrees to plan of care.   Final Clinical Impressions(s) / UC Diagnoses   Final diagnoses:  Acute right-sided low back pain with right-sided sciatica     Discharge Instructions     Take the muscle relaxer as needed for muscle spasm; Do not drive, operate machinery, or drink alcohol with this medication as it may make you drowsy.    Follow up with your primary care provider or an orthopedist if your pain is not improving.        ED Prescriptions    Medication Sig Dispense Auth. Provider   methocarbamol (ROBAXIN) 500 MG tablet Take 1 tablet (500 mg total) by mouth 2 (two) times daily. 20 tablet Mickie Bail, NP     I have reviewed the PDMP during this encounter.   Mickie Bail, NP 11/01/20 1047

## 2020-11-01 NOTE — Discharge Instructions (Signed)
Take the muscle relaxer as needed for muscle spasm; Do not drive, operate machinery, or drink alcohol with this medication as it may make you drowsy.    Follow up with your primary care provider or an orthopedist if your pain is not improving.       

## 2020-11-01 NOTE — ED Triage Notes (Signed)
Pt reports 2 days of Rt lower back pain. Pain increases with activity .

## 2021-07-25 ENCOUNTER — Ambulatory Visit (HOSPITAL_COMMUNITY)
Admission: EM | Admit: 2021-07-25 | Discharge: 2021-07-25 | Disposition: A | Discharge status: Home / Self Care | Payer: Federal, State, Local not specified - PPO | Attending: Student | Admitting: Student

## 2021-07-25 ENCOUNTER — Other Ambulatory Visit: Payer: Self-pay

## 2021-07-25 ENCOUNTER — Encounter (HOSPITAL_COMMUNITY): Payer: Self-pay | Admitting: *Deleted

## 2021-07-25 DIAGNOSIS — S39012A Strain of muscle, fascia and tendon of lower back, initial encounter: Secondary | ICD-10-CM | POA: Diagnosis not present

## 2021-07-25 MED ORDER — METHOCARBAMOL 500 MG PO TABS: 500.0000 mg | ORAL_TABLET | Freq: Two times a day (BID) | ORAL | 0 refills | Status: AC

## 2021-07-25 NOTE — Discharge Instructions (Addendum)
-  Robaxin (methocarbamol) up to 2x daily.  This medication can cause drowsiness, do not take when you have to drive or operate machinery. -Continue Tylenol. Limit NSAID (alleve, ibuprofen, naproxen, etc) use given your history of GI bleed. -If symptoms persist in 3-5 days, follow-up with an orthopedist. I recommend [facility] at [address], [place], [place] [address]. You can schedule an appointment by calling [phone]) or online ([email or web address]), but they also have a walk-in clinic M-F 8a-8p and Sat 10a-3p.

## 2021-07-25 NOTE — ED Triage Notes (Signed)
Pt reports Back pain .

## 2021-07-25 NOTE — ED Provider Notes (Signed)
[facility]    CSN: [id number] Arrival date & time: 07/25/21  1003      History   Chief Complaint Chief Complaint  Patient presents with   Back Pain    HPI Gary George is a 70 y.o. male presenting with R lower back pain. Medical history hypertension, gastric ulcer, sleep apnea, back pain. Was last evaluated at our urgent care for the same issue 10/2020.  Endorses 3 days of right lumbar paraspinous muscle tenderness with radiation of pain down his right leg with standing and ambulating.  Denies new trauma or overuse.  Tylenol providing minimal relief. Denies numbness in arms/legs, denies weakness in arms/legs, denies saddle anesthesia, denies bowel/bladder incontinence, denies urinary retention, denies constipation.   HPI  Past Medical History:  Diagnosis Date   Back pain    Cataract    Right eye    Gastric ulcer    X3    Hypertension    Left arm weakness    r/t prior broken bone    Obesity    Sleep apnea    Upper GI bleed     Patient Active Problem List   Diagnosis Date Noted   Osteoarthritis of right knee 12/09/2015   Back pain 01/16/2014   Helicobacter pylori gastritis 01/16/2014   Gastric ulcer 01/06/2014   OSA on CPAP 01/05/2014   HTN (hypertension) 01/05/2014   Morbid obesity (HCC) 01/05/2014    Past Surgical History:  Procedure Laterality Date   ESOPHAGOGASTRODUODENOSCOPY N/A 01/06/2014   Procedure: ESOPHAGOGASTRODUODENOSCOPY (EGD);  Surgeon: Rachael Fee, MD;  Location: [facility];  Service: Endoscopy;  Laterality: N/A;   ESOPHAGOGASTRODUODENOSCOPY N/A 02/28/2014   Procedure: ESOPHAGOGASTRODUODENOSCOPY (EGD);  Surgeon: Rachael Fee, MD;  Location: [facility];  Service: Endoscopy;  Laterality: N/A;       Home Medications    Prior to Admission medications   Medication Sig Start Date End Date Taking? Authorizing Provider  amLODipine (NORVASC) 10 MG tablet Take 10 mg by mouth daily.    [provider]  atorvastatin  (LIPITOR) 20 MG tablet Take 10 mg by mouth at bedtime.  11/27/13   [provider]  cloNIDine (CATAPRES) 0.1 MG tablet Take 0.1 mg by mouth 2 (two) times daily.    [provider]  diclofenac sodium (VOLTAREN) 1 % GEL Apply 2 g topically 4 (four) times daily as needed (knee pain). 12/09/15   Tyrone Nine, MD  hydrochlorothiazide (HYDRODIURIL) 25 MG tablet Take 25 mg by mouth daily.    [provider]  lisinopril (PRINIVIL,ZESTRIL) 40 MG tablet Take 40 mg by mouth daily.    [provider]  meloxicam (MOBIC) 15 MG tablet TAKE 1 TABLET BY MOUTH DAILY 12/30/16   Rumley, [place] N, DO  methocarbamol (ROBAXIN) 500 MG tablet Take 1 tablet (500 mg total) by mouth 2 (two) times daily. 07/25/21   Rhys Martini, PA-C  multivitamin-iron-minerals-folic acid (CENTRUM) chewable tablet Chew 1 tablet by mouth daily. 01/16/14   Tyrone Nine, MD  pantoprazole (PROTONIX) 40 MG tablet TAKE 1 TABLET (40 MG TOTAL) BY MOUTH DAILY. 10/30/15   Tyrone Nine, MD    Family History Family History  Problem Relation Age of Onset   Aneurysm Mother     Social History Social History   Tobacco Use   Smoking status: Never   Smokeless tobacco: Never  Substance Use Topics   Alcohol use: No     Allergies   Patient has no known allergies.  Review of Systems Review of Systems  Constitutional:  Negative for chills, fever and unexpected weight change.  Respiratory:  Negative for chest tightness and shortness of breath.   Cardiovascular:  Negative for chest pain and palpitations.  Gastrointestinal:  Negative for abdominal pain, diarrhea, nausea and vomiting.  Genitourinary:  Negative for decreased urine volume, difficulty urinating and frequency.  Musculoskeletal:  Positive for back pain. Negative for arthralgias, gait problem, joint swelling, myalgias, neck pain and neck stiffness.  Skin:  Negative for wound.  Neurological:  Negative for dizziness, tremors, seizures, syncope, facial  asymmetry, speech difficulty, weakness, light-headedness, numbness and headaches.  All other systems reviewed and are negative.   Physical Exam Triage Vital Signs ED Triage Vitals  Enc Vitals Group     BP 07/25/21 1029 (!) 127/43     Pulse Rate 07/25/21 1029 90     Resp 07/25/21 1029 20     Temp 07/25/21 1029 98.2 F (36.8 C)     Temp src --      SpO2 07/25/21 1029 97 %     Weight --      Height --      Head Circumference --      Peak Flow --      Pain Score 07/25/21 1028 8     Pain Loc --      Pain Edu? --      Excl. in GC? --    No data found.  Updated Vital Signs BP (!) 127/43   Pulse 90   Temp 98.2 F (36.8 C)   Resp 20   SpO2 97%   Visual Acuity Right Eye Distance:   Left Eye Distance:   Bilateral Distance:    Right Eye Near:   Left Eye Near:    Bilateral Near:     Physical Exam Vitals reviewed.  Constitutional:      General: He is not in acute distress.    Appearance: Normal appearance. He is obese. He is not ill-appearing.  HENT:     Head: Normocephalic and atraumatic.  Cardiovascular:     Rate and Rhythm: Normal rate and regular rhythm.     Heart sounds: Normal heart sounds.  Pulmonary:     Effort: Pulmonary effort is normal.     Breath sounds: Normal breath sounds and air entry.  Abdominal:     Tenderness: There is no abdominal tenderness. There is no right CVA tenderness, left CVA tenderness, guarding or rebound.  Musculoskeletal:     Cervical back: Normal range of motion. No swelling, deformity, signs of trauma, rigidity, spasms, tenderness, bony tenderness or crepitus. No pain with movement.     Thoracic back: No swelling, deformity, signs of trauma, spasms, tenderness or bony tenderness. Normal range of motion. No scoliosis.     Lumbar back: No swelling, deformity, signs of trauma, spasms, tenderness or bony tenderness. Normal range of motion. Negative right straight leg raise test and negative left straight leg raise test. No scoliosis.      Comments: R lumbar paraspinous muscle tenderness to palpation, pain elicited with flexion lumbar spine.  Gait intact but with pain.  Strength and sensation intact upper and lower extremities.  No saddle anesthesia. No midline spinous tenderness, deformity, stepoff.  Absolutely no other injury, deformity, tenderness, ecchymosis, abrasion.  Neurological:     General: No focal deficit present.     Mental Status: He is alert.     Cranial Nerves: No cranial nerve deficit.  Psychiatric:  Mood and Affect: Mood normal.        Behavior: Behavior normal.        Thought Content: Thought content normal.        Judgment: Judgment normal.     UC Treatments / Results  Labs (all labs ordered are listed, but only abnormal results are displayed) Labs Reviewed - No data to display  EKG   Radiology No results found.  Procedures Procedures (including critical care time)  Medications Ordered in UC Medications - No data to display  Initial Impression / Assessment and Plan / UC Course  I have reviewed the triage vital signs and the nursing notes.  Pertinent labs & imaging results that were available during my care of the patient were reviewed by me and considered in my medical decision making (see chart for details).     This patient is a very pleasant 70 y.o. year old male presenting with R lumbar strain and R sciatica.  He is unable to take NSAIDs due to history of GI bleed; will treat with Robaxin and Tylenol.  Follow-up with Ortho if symptoms persist. Red flag symptoms and ED return precautions discussed. Patient verbalizes understanding and agreement.  .   Final Clinical Impressions(s) / UC Diagnoses   Final diagnoses:  Strain of lumbar region, initial encounter     Discharge Instructions      -Robaxin (methocarbamol) up to 2x daily.  This medication can cause drowsiness, do not take when you have to drive or operate machinery. -Continue Tylenol. Limit NSAID (alleve,  ibuprofen, naproxen, etc) use given your history of GI bleed. -If symptoms persist in 3-5 days, follow-up with an orthopedist. I recommend [facility] at [address], [place], [place] [address]. You can schedule an appointment by calling [phone]) or online ([email or web address]), but they also have a walk-in clinic M-F 8a-8p and Sat 10a-3p.      ED Prescriptions     Medication Sig Dispense Auth. Provider   methocarbamol (ROBAXIN) 500 MG tablet Take 1 tablet (500 mg total) by mouth 2 (two) times daily. 20 tablet Rhys Martini, PA-C      PDMP not reviewed this encounter.   Rhys Martini, PA-C 07/25/21 1126
